# Patient Record
Sex: Female | Born: 1965 | Race: White | Hispanic: No | Marital: Married | Smoking: Former smoker
Health system: Southern US, Community
[De-identification: ages and names within clinical notes are randomized; demographics above are authoritative.]

## PROBLEM LIST (undated history)

## (undated) DIAGNOSIS — E785 Hyperlipidemia, unspecified: Secondary | ICD-10-CM

## (undated) DIAGNOSIS — N816 Rectocele: Secondary | ICD-10-CM

## (undated) DIAGNOSIS — F32A Depression, unspecified: Secondary | ICD-10-CM

## (undated) DIAGNOSIS — F419 Anxiety disorder, unspecified: Secondary | ICD-10-CM

## (undated) DIAGNOSIS — R112 Nausea with vomiting, unspecified: Secondary | ICD-10-CM

## (undated) DIAGNOSIS — L409 Psoriasis, unspecified: Secondary | ICD-10-CM

## (undated) DIAGNOSIS — E039 Hypothyroidism, unspecified: Secondary | ICD-10-CM

## (undated) DIAGNOSIS — E669 Obesity, unspecified: Secondary | ICD-10-CM

## (undated) DIAGNOSIS — Z9089 Acquired absence of other organs: Secondary | ICD-10-CM

## (undated) DIAGNOSIS — Z9889 Other specified postprocedural states: Secondary | ICD-10-CM

## (undated) DIAGNOSIS — Z90710 Acquired absence of both cervix and uterus: Secondary | ICD-10-CM

## (undated) DIAGNOSIS — J449 Chronic obstructive pulmonary disease, unspecified: Secondary | ICD-10-CM

## (undated) DIAGNOSIS — I10 Essential (primary) hypertension: Secondary | ICD-10-CM

## (undated) DIAGNOSIS — F1111 Opioid abuse, in remission: Secondary | ICD-10-CM

## (undated) HISTORY — PX: TUBAL LIGATION: SHX77

## (undated) HISTORY — PX: SPINAL FUSION: SHX223

## (undated) HISTORY — PX: BACK SURGERY: SHX140

## (undated) HISTORY — PX: FACIAL COSMETIC SURGERY: SHX629

---

## 2013-04-09 ENCOUNTER — Ambulatory Visit: Payer: Self-pay

## 2020-04-15 ENCOUNTER — Encounter (HOSPITAL_BASED_OUTPATIENT_CLINIC_OR_DEPARTMENT_OTHER): Payer: Self-pay | Admitting: Obstetrics and Gynecology

## 2020-04-15 NOTE — H&P (Signed)
Marie Delacruz is an 54 y.o. female. With  w/sypmtomatic posterior prolapse. Occasional urinary incontinence  but declines urology referral. Hx SVD x2.  Pertinent Gynecological History: Menses: post-menopausal Bleeding: n/a Contraception: post menopausal status DES exposure: denies Blood transfusions: none Sexually transmitted diseases: no past history Previous GYN Procedures: n/a  Last mammogram: normal Date: several years ago Last pap: normal Date: 12/2019 OB History: G2, P2 - SVD x 2   Menstrual History: No LMP recorded.    Past Medical History:  Diagnosis Date  . COPD (chronic obstructive pulmonary disease) (HCC)   . HLD (hyperlipidemia)   . HTN (hypertension)   . Hypothyroid   . 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 Obesity   . Prolapse of posterior vaginal wall     Past Surgical History:  Procedure Laterality Date  . FACIAL COSMETIC SURGERY    . SPINAL FUSION    . TUBAL LIGATION      No family history on file.  Social History:  has no history on file for tobacco use, alcohol use, and drug use.  Allergies: Not on File  No medications prior to admission.    Review of Systems  There were no vitals taken for this visit. Physical Exam  Gen: well appearing, NAD CV: Reg rate Pulm: NWOB Abd: soft, nondistended, nontender, no masses, obese GYN: uterus 8 week size, no adnexa ttp/CMT., Posterior vaginal prolapse. CST negative. No anterior prolapse noted.  Ext: No edema b/l   No results found for this or any previous visit (from the past 24 hour(s)).  No results found.  Assessment/Plan: 54 yo G2 P2 with symptomatic posterior prolapse without uterine prolapse. All options were discussed for management. We discussed medical management and surgical options. After careful discussing, weighing the risks and benefits, she elected to have a hysterectomy.  We discussed my plan int he office for  surgery which includes TVH, cystoscopy, McCalls, posterior repair, and POSSIBLE salpingectomy and POSSIBLE anterior repair.   Risks were reviewed including infection, bleeding, damage to surrounding structures, need for additional procedures, conversion to an open procedure, future prolapse, pain s/s scarring, the possibility of cancer/unexpected abnormality on final pathology, recurrent prolapse, worsening urinary/bowel issues, and thromboembolic disease.  We discussed the risks/benefits of retaining tubes and ovaries vs removal and she elects to salpingo-oophorectomy if able to remove bothbut wants to keep ovaries.   Ranae Pila 04/15/2020, 9:13 AM

## 2020-04-29 NOTE — Progress Notes (Signed)
Marie Delacruz  04/29/2020      Your procedure is scheduled 05/08/2020   Report to [facility] [place]      0530A.M.  Call this number if you have problems the morning of surgery:[phone]  OUR ADDRESS IS [address], WE ARE LOCATED IN THE [facility]ICAL [facility] WITH [facility].   Remember:  Do not eat food after midnight    NO SOLID FOOD AFTER MIDNIGHT THE NIGHT PRIOR TO SURGERY. NOTHING BY MOUTH EXCEPT CLEAR LIQUIDS UNTIL  0430am . PLEASE FINISH ENSURE DRINK PER SURGEON ORDER  WHICH NEEDS TO BE COMPLETED AT 0430am.  Take these medicines the morning of surgery with A SIP OF WATER Synthroid, flonase if needed, Cymbalta, Inhalers as usual and bring   Do not wear jewelry, make-up or nail polish.  Do not wear lotions, powders, or perfumes, or deoderant.  Do not shave 48 hours prior to surgery.  Men may shave face and neck.  Do not bring valuables to the hospital.  [facility] is not responsible for any belongings or valuables.  Contacts, dentures or bridgework may not be worn into surgery.  Leave your suitcase in the car.  After surgery it may be brought to your room.  For patients admitted to the hospital, discharge time will be determined by your treatment team.  Patients discharged the day of surgery will not be allowed to drive home.     Please read over the following fact sheets that you were given:   [facility] - Preparing for Surgery Before surgery, you can play an important role.  Because skin is not sterile, your skin needs to be as free of germs as possible.  You can reduce the number of germs on your skin by washing with CHG (chlorahexidine gluconate) soap before surgery.  CHG is an antiseptic cleaner which kills germs and bonds with the skin to continue killing germs even after washing. Please DO NOT use if you have an allergy to CHG or antibacterial soaps.  If your skin becomes reddened/irritated stop using the CHG and inform your nurse when you arrive  at Short Stay. Do not shave (including legs and underarms) for at least 48 hours prior to the first CHG shower.  You may shave your face/neck. Please follow these instructions carefully:  1.  Shower with CHG Soap the night before surgery and the  morning of Surgery.  2.  If you choose to wash your hair, wash your hair first as usual with your  normal  shampoo.  3.  After you shampoo, rinse your hair and body thoroughly to remove the  shampoo.                           4.  Use CHG as you would any other liquid soap.  You can apply chg directly  to the skin and wash                       Gently with a scrungie or clean washcloth.  5.  Apply the CHG Soap to your body ONLY FROM THE NECK DOWN.   Do not use on face/ open                           Wound or open sores. Avoid contact with eyes, ears mouth and genitals (private parts).  Wash face,  Genitals (private parts) with your normal soap.             6.  Wash thoroughly, paying special attention to the area where your surgery  will be performed.  7.  Thoroughly rinse your body with warm water from the neck down.  8.  DO NOT shower/wash with your normal soap after using and rinsing off  the CHG Soap.                9.  Pat yourself dry with a clean towel.            10.  Wear clean pajamas.            11.  Place clean sheets on your bed the night of your first shower and do not  sleep with pets. Day of Surgery : Do not apply any lotions/deodorants the morning of surgery.  Please wear clean clothes to the hospital/surgery center.  FAILURE TO FOLLOW THESE INSTRUCTIONS MAY RESULT IN THE CANCELLATION OF YOUR SURGERY PATIENT SIGNATURE_________________________________  NURSE SIGNATURE__________________________________  ________________________________________________________________________

## 2020-04-30 ENCOUNTER — Other Ambulatory Visit: Payer: Self-pay

## 2020-04-30 ENCOUNTER — Encounter (HOSPITAL_COMMUNITY)
Admission: RE | Admit: 2020-04-30 | Discharge: 2020-04-30 | Disposition: A | Discharge status: Home / Self Care | Payer: 59 | Source: Ambulatory Visit | Attending: Obstetrics and Gynecology | Admitting: Obstetrics and Gynecology

## 2020-04-30 ENCOUNTER — Encounter (HOSPITAL_COMMUNITY): Payer: Self-pay

## 2020-04-30 DIAGNOSIS — Z01818 Encounter for other preprocedural examination: Secondary | ICD-10-CM | POA: Diagnosis not present

## 2020-04-30 HISTORY — DX: Anxiety disorder, unspecified: F41.9

## 2020-04-30 HISTORY — DX: Other specified postprocedural states: R11.2

## 2020-04-30 HISTORY — DX: Other specified postprocedural states: Z98.890

## 2020-04-30 HISTORY — DX: Depression, unspecified: F32.A

## 2020-04-30 LAB — BASIC METABOLIC PANEL
Anion gap: 10 (ref 5–15)
BUN: 11 mg/dL (ref 6–20)
CO2: 29 mmol/L (ref 22–32)
Calcium: 9.7 mg/dL (ref 8.9–10.3)
Chloride: 100 mmol/L (ref 98–111)
Creatinine, Ser: 0.9 mg/dL (ref 0.44–1.00)
GFR calc Af Amer: >60 mL/min (ref >60)
GFR calc non Af Amer: >60 mL/min (ref >60)
Glucose, Bld: 143 mg/dL — ABNORMAL HIGH (ref 70–99)
Potassium: 3.6 mmol/L (ref 3.5–5.1)
Sodium: 139 mmol/L (ref 135–145)

## 2020-04-30 LAB — CBC
HCT: 43.5 % (ref 36.0–46.0)
Hemoglobin: 14.5 g/dL (ref 12.0–15.0)
MCH: 32.3 pg (ref 26.0–34.0)
MCHC: 33.3 g/dL (ref 30.0–36.0)
MCV: 96.9 fL (ref 80.0–100.0)
Platelets: 334 10*3/uL (ref 150–400)
RBC: 4.49 MIL/uL (ref 3.87–5.11)
RDW: 13.9 % (ref 11.5–15.5)
WBC: 11.4 10*3/uL — ABNORMAL HIGH (ref 4.0–10.5)
nRBC: 0 % (ref 0.0–0.2)

## 2020-05-05 ENCOUNTER — Other Ambulatory Visit (HOSPITAL_COMMUNITY): Payer: 59

## 2020-05-05 ENCOUNTER — Other Ambulatory Visit (HOSPITAL_COMMUNITY)
Admission: RE | Admit: 2020-05-05 | Discharge: 2020-05-05 | Disposition: A | Discharge status: Home / Self Care | Payer: 59 | Source: Ambulatory Visit | Attending: Obstetrics and Gynecology | Admitting: Obstetrics and Gynecology

## 2020-05-05 DIAGNOSIS — Z20822 Contact with and (suspected) exposure to covid-19: Secondary | ICD-10-CM | POA: Diagnosis not present

## 2020-05-05 DIAGNOSIS — Z01812 Encounter for preprocedural laboratory examination: Secondary | ICD-10-CM | POA: Insufficient documentation

## 2020-05-05 LAB — SARS CORONAVIRUS 2 (TAT 6-24 HRS): SARS Coronavirus 2: NEGATIVE

## 2020-05-08 ENCOUNTER — Encounter (HOSPITAL_BASED_OUTPATIENT_CLINIC_OR_DEPARTMENT_OTHER): Payer: Self-pay | Admitting: Obstetrics and Gynecology

## 2020-05-08 ENCOUNTER — Encounter (HOSPITAL_BASED_OUTPATIENT_CLINIC_OR_DEPARTMENT_OTHER)
Admission: RE | Disposition: A | Discharge status: Home / Self Care | Payer: Self-pay | Source: Home / Self Care | Attending: Obstetrics and Gynecology

## 2020-05-08 ENCOUNTER — Observation Stay (HOSPITAL_BASED_OUTPATIENT_CLINIC_OR_DEPARTMENT_OTHER): Payer: 59 | Admitting: Anesthesiology

## 2020-05-08 ENCOUNTER — Observation Stay (HOSPITAL_BASED_OUTPATIENT_CLINIC_OR_DEPARTMENT_OTHER)
Admission: RE | Admit: 2020-05-08 | Discharge: 2020-05-09 | Disposition: A | Discharge status: Home / Self Care | Payer: 59 | Attending: Obstetrics and Gynecology | Admitting: Obstetrics and Gynecology

## 2020-05-08 ENCOUNTER — Other Ambulatory Visit: Payer: Self-pay

## 2020-05-08 DIAGNOSIS — N819 Female genital prolapse, unspecified: Secondary | ICD-10-CM | POA: Diagnosis present

## 2020-05-08 DIAGNOSIS — J449 Chronic obstructive pulmonary disease, unspecified: Secondary | ICD-10-CM | POA: Insufficient documentation

## 2020-05-08 DIAGNOSIS — N8189 Other female genital prolapse: Secondary | ICD-10-CM | POA: Diagnosis not present

## 2020-05-08 DIAGNOSIS — E039 Hypothyroidism, unspecified: Secondary | ICD-10-CM | POA: Diagnosis not present

## 2020-05-08 DIAGNOSIS — I10 Essential (primary) hypertension: Secondary | ICD-10-CM | POA: Diagnosis not present

## 2020-05-08 DIAGNOSIS — Z90710 Acquired absence of both cervix and uterus: Secondary | ICD-10-CM | POA: Diagnosis present

## 2020-05-08 HISTORY — PX: CYSTOSCOPY: SHX5120

## 2020-05-08 HISTORY — DX: Chronic obstructive pulmonary disease, unspecified: J44.9

## 2020-05-08 HISTORY — DX: Obesity, unspecified: E66.9

## 2020-05-08 HISTORY — DX: Hypothyroidism, unspecified: E03.9

## 2020-05-08 HISTORY — DX: Hyperlipidemia, unspecified: E78.5

## 2020-05-08 HISTORY — DX: Opioid abuse, in remission: F11.11

## 2020-05-08 HISTORY — PX: RECTOCELE REPAIR: SHX761

## 2020-05-08 HISTORY — PX: VAGINAL HYSTERECTOMY: SHX2639

## 2020-05-08 HISTORY — DX: Rectocele: N81.6

## 2020-05-08 HISTORY — DX: Essential (primary) hypertension: I10

## 2020-05-08 LAB — TYPE AND SCREEN
ABO/RH(D): O POS
Antibody Screen: NEGATIVE

## 2020-05-08 LAB — ABO/RH: ABO/RH(D): O POS

## 2020-05-08 SURGERY — HYSTERECTOMY, VAGINAL
Anesthesia: General | Site: Vagina

## 2020-05-08 MED ORDER — ACETAMINOPHEN 500 MG PO TABS
1000.0000 mg | ORAL_TABLET | ORAL | Status: AC
  Administered 2020-05-08: 1000 mg via ORAL

## 2020-05-08 MED ORDER — VASOPRESSIN 20 UNIT/ML IV SOLN
INTRAVENOUS | Status: DC | PRN
  Administered 2020-05-08 (×2): 20 [IU] via INTRAMUSCULAR

## 2020-05-08 MED ORDER — ACETAMINOPHEN 500 MG PO TABS
1000.0000 mg | ORAL_TABLET | Freq: Four times a day (QID) | ORAL | Status: DC
  Administered 2020-05-08 – 2020-05-09 (×4): 1000 mg via ORAL

## 2020-05-08 MED ORDER — KETAMINE HCL 10 MG/ML IJ SOLN
INTRAMUSCULAR | Status: AC
  Filled 2020-05-08: qty 1

## 2020-05-08 MED ORDER — GABAPENTIN 300 MG PO CAPS
300.0000 mg | ORAL_CAPSULE | ORAL | Status: AC
  Administered 2020-05-08: 300 mg via ORAL

## 2020-05-08 MED ORDER — PROPOFOL 10 MG/ML IV BOLUS
INTRAVENOUS | Status: AC
  Filled 2020-05-08: qty 20

## 2020-05-08 MED ORDER — ALUM & MAG HYDROXIDE-SIMETH 200-200-20 MG/5ML PO SUSP: 30.0000 mL | ORAL | Status: DC | PRN

## 2020-05-08 MED ORDER — ACETAMINOPHEN 500 MG PO TABS
ORAL_TABLET | ORAL | Status: AC
  Filled 2020-05-08: qty 2

## 2020-05-08 MED ORDER — MIDAZOLAM HCL 2 MG/2ML IJ SOLN
INTRAMUSCULAR | Status: AC
  Filled 2020-05-08: qty 2

## 2020-05-08 MED ORDER — SUGAMMADEX SODIUM 200 MG/2ML IV SOLN
INTRAVENOUS | Status: DC | PRN
  Administered 2020-05-08: 200 mg via INTRAVENOUS

## 2020-05-08 MED ORDER — KETOROLAC TROMETHAMINE 30 MG/ML IJ SOLN
INTRAMUSCULAR | Status: AC
  Filled 2020-05-08: qty 1

## 2020-05-08 MED ORDER — ROCURONIUM BROMIDE 10 MG/ML (PF) SYRINGE
PREFILLED_SYRINGE | INTRAVENOUS | Status: AC
  Filled 2020-05-08: qty 10

## 2020-05-08 MED ORDER — HYDROMORPHONE HCL 1 MG/ML IJ SOLN
INTRAMUSCULAR | Status: AC
  Filled 2020-05-08: qty 1

## 2020-05-08 MED ORDER — ONDANSETRON HCL 4 MG/2ML IJ SOLN
INTRAMUSCULAR | Status: DC | PRN
  Administered 2020-05-08: 4 mg via INTRAVENOUS

## 2020-05-08 MED ORDER — IBUPROFEN 800 MG PO TABS: 800.0000 mg | ORAL_TABLET | Freq: Four times a day (QID) | ORAL | Status: DC

## 2020-05-08 MED ORDER — DOCUSATE SODIUM 100 MG PO CAPS
ORAL_CAPSULE | ORAL | Status: AC
  Filled 2020-05-08: qty 1

## 2020-05-08 MED ORDER — TRAMADOL HCL 50 MG PO TABS
ORAL_TABLET | ORAL | Status: AC
  Filled 2020-05-08: qty 1

## 2020-05-08 MED ORDER — SIMETHICONE 80 MG PO CHEW
CHEWABLE_TABLET | ORAL | Status: AC
  Filled 2020-05-08: qty 1

## 2020-05-08 MED ORDER — SCOPOLAMINE 1 MG/3DAYS TD PT72
1.0000 | MEDICATED_PATCH | TRANSDERMAL | Status: DC
  Administered 2020-05-08: 1.5 mg via TRANSDERMAL

## 2020-05-08 MED ORDER — SIMETHICONE 80 MG PO CHEW
80.0000 mg | CHEWABLE_TABLET | Freq: Four times a day (QID) | ORAL | Status: DC | PRN
  Administered 2020-05-08: 80 mg via ORAL

## 2020-05-08 MED ORDER — SCOPOLAMINE 1 MG/3DAYS TD PT72
MEDICATED_PATCH | TRANSDERMAL | Status: AC
  Filled 2020-05-08: qty 1

## 2020-05-08 MED ORDER — FLUTICASONE PROPIONATE 50 MCG/ACT NA SUSP: 1.0000 | Freq: Every day | NASAL | Status: DC | PRN

## 2020-05-08 MED ORDER — DULOXETINE HCL 60 MG PO CPEP: 60.0000 mg | ORAL_CAPSULE | Freq: Every day | ORAL | Status: DC

## 2020-05-08 MED ORDER — LISINOPRIL 20 MG PO TABS: 20.0000 mg | ORAL_TABLET | Freq: Every day | ORAL | Status: DC

## 2020-05-08 MED ORDER — PROPOFOL 10 MG/ML IV BOLUS
INTRAVENOUS | Status: DC | PRN
  Administered 2020-05-08: 180 mg via INTRAVENOUS

## 2020-05-08 MED ORDER — GABAPENTIN 300 MG PO CAPS
ORAL_CAPSULE | ORAL | Status: AC
  Filled 2020-05-08: qty 1

## 2020-05-08 MED ORDER — UMECLIDINIUM-VILANTEROL 62.5-25 MCG/INH IN AEPB: 1.0000 | INHALATION_SPRAY | Freq: Every day | RESPIRATORY_TRACT | Status: DC | PRN

## 2020-05-08 MED ORDER — ATORVASTATIN CALCIUM 40 MG PO TABS
40.0000 mg | ORAL_TABLET | Freq: Every day | ORAL | Status: DC
  Administered 2020-05-08: 40 mg via ORAL
  Filled 2020-05-08: qty 1

## 2020-05-08 MED ORDER — MIDAZOLAM HCL 5 MG/5ML IJ SOLN
INTRAMUSCULAR | Status: DC | PRN
  Administered 2020-05-08: 2 mg via INTRAVENOUS

## 2020-05-08 MED ORDER — TRAMADOL HCL 50 MG PO TABS
50.0000 mg | ORAL_TABLET | ORAL | Status: DC | PRN
  Administered 2020-05-08 – 2020-05-09 (×3): 50 mg via ORAL

## 2020-05-08 MED ORDER — OXYCODONE HCL 5 MG PO TABS: 5.0000 mg | ORAL_TABLET | Freq: Once | ORAL | Status: DC | PRN

## 2020-05-08 MED ORDER — DOCUSATE SODIUM 100 MG PO CAPS
100.0000 mg | ORAL_CAPSULE | Freq: Two times a day (BID) | ORAL | Status: DC
  Administered 2020-05-08 (×2): 100 mg via ORAL

## 2020-05-08 MED ORDER — CEFAZOLIN SODIUM-DEXTROSE 2-4 GM/100ML-% IV SOLN
2.0000 g | INTRAVENOUS | Status: AC
  Administered 2020-05-08: 2 g via INTRAVENOUS

## 2020-05-08 MED ORDER — ONDANSETRON HCL 4 MG/2ML IJ SOLN: 4.0000 mg | Freq: Once | INTRAMUSCULAR | Status: DC | PRN

## 2020-05-08 MED ORDER — KETOROLAC TROMETHAMINE 30 MG/ML IJ SOLN
INTRAMUSCULAR | Status: DC | PRN
  Administered 2020-05-08: 30 mg via INTRAVENOUS

## 2020-05-08 MED ORDER — PANTOPRAZOLE SODIUM 40 MG PO TBEC
DELAYED_RELEASE_TABLET | ORAL | Status: AC
  Filled 2020-05-08: qty 1

## 2020-05-08 MED ORDER — LIDOCAINE 2% (20 MG/ML) 5 ML SYRINGE
INTRAMUSCULAR | Status: AC
  Filled 2020-05-08: qty 5

## 2020-05-08 MED ORDER — TRIAMCINOLONE ACETONIDE 0.1 % EX CREA: 1.0000 "application " | TOPICAL_CREAM | Freq: Every day | CUTANEOUS | Status: DC | PRN

## 2020-05-08 MED ORDER — OXYCODONE HCL 5 MG/5ML PO SOLN: 5.0000 mg | Freq: Once | ORAL | Status: DC | PRN

## 2020-05-08 MED ORDER — MENTHOL 3 MG MT LOZG: 1.0000 | LOZENGE | OROMUCOSAL | Status: DC | PRN

## 2020-05-08 MED ORDER — ONDANSETRON HCL 4 MG PO TABS: 4.0000 mg | ORAL_TABLET | Freq: Four times a day (QID) | ORAL | Status: DC | PRN

## 2020-05-08 MED ORDER — KETAMINE HCL 10 MG/ML IJ SOLN
INTRAMUSCULAR | Status: DC | PRN
  Administered 2020-05-08: 30 mg via INTRAVENOUS

## 2020-05-08 MED ORDER — FENTANYL CITRATE (PF) 100 MCG/2ML IJ SOLN
INTRAMUSCULAR | Status: DC | PRN
Start: 2020-05-08 — End: 2020-05-08
  Administered 2020-05-08: 50 ug via INTRAVENOUS
  Administered 2020-05-08: 100 ug via INTRAVENOUS
  Administered 2020-05-08 (×2): 50 ug via INTRAVENOUS

## 2020-05-08 MED ORDER — KETOROLAC TROMETHAMINE 30 MG/ML IJ SOLN
30.0000 mg | Freq: Four times a day (QID) | INTRAMUSCULAR | Status: DC
  Administered 2020-05-08 – 2020-05-09 (×3): 30 mg via INTRAVENOUS

## 2020-05-08 MED ORDER — HYDROMORPHONE HCL 1 MG/ML IJ SOLN
0.2500 mg | INTRAMUSCULAR | Status: DC | PRN
  Administered 2020-05-08 (×2): 0.5 mg via INTRAVENOUS

## 2020-05-08 MED ORDER — DEXTROSE-NACL 5-0.45 % IV SOLN: 125.0000 mL/h | INTRAVENOUS | Status: DC

## 2020-05-08 MED ORDER — SODIUM CHLORIDE FLUSH 0.9 % IV SOLN
INTRAVENOUS | Status: DC | PRN
  Administered 2020-05-08 (×2): 60 mL

## 2020-05-08 MED ORDER — LISINOPRIL 20 MG PO TABS
20.0000 mg | ORAL_TABLET | Freq: Every day | ORAL | Status: DC
  Administered 2020-05-08: 20 mg via ORAL
  Filled 2020-05-08: qty 1

## 2020-05-08 MED ORDER — LACTATED RINGERS IV SOLN
INTRAVENOUS | Status: DC
  Administered 2020-05-08: 1000 mL via INTRAVENOUS

## 2020-05-08 MED ORDER — HYDROMORPHONE HCL 1 MG/ML IJ SOLN
0.2000 mg | INTRAMUSCULAR | Status: DC | PRN
  Administered 2020-05-08: 0.6 mg via INTRAVENOUS

## 2020-05-08 MED ORDER — LIDOCAINE 2% (20 MG/ML) 5 ML SYRINGE
INTRAMUSCULAR | Status: DC | PRN
  Administered 2020-05-08: 80 mg via INTRAVENOUS

## 2020-05-08 MED ORDER — DEXAMETHASONE SODIUM PHOSPHATE 10 MG/ML IJ SOLN
INTRAMUSCULAR | Status: DC | PRN
  Administered 2020-05-08: 10 mg via INTRAVENOUS

## 2020-05-08 MED ORDER — CEFAZOLIN SODIUM-DEXTROSE 2-4 GM/100ML-% IV SOLN
INTRAVENOUS | Status: AC
  Filled 2020-05-08: qty 100

## 2020-05-08 MED ORDER — ONDANSETRON HCL 4 MG/2ML IJ SOLN: 4.0000 mg | Freq: Four times a day (QID) | INTRAMUSCULAR | Status: DC | PRN

## 2020-05-08 MED ORDER — FENTANYL CITRATE (PF) 250 MCG/5ML IJ SOLN
INTRAMUSCULAR | Status: AC
  Filled 2020-05-08: qty 5

## 2020-05-08 MED ORDER — TRAMADOL HCL 50 MG PO TABS
50.0000 mg | ORAL_TABLET | Freq: Four times a day (QID) | ORAL | Status: DC | PRN
  Administered 2020-05-08 (×2): 50 mg via ORAL

## 2020-05-08 MED ORDER — ESTRADIOL 0.1 MG/GM VA CREA
TOPICAL_CREAM | VAGINAL | Status: DC | PRN
  Administered 2020-05-08: 1 via VAGINAL

## 2020-05-08 MED ORDER — ROCURONIUM BROMIDE 10 MG/ML (PF) SYRINGE
PREFILLED_SYRINGE | INTRAVENOUS | Status: DC | PRN
  Administered 2020-05-08: 50 mg via INTRAVENOUS
  Administered 2020-05-08: 10 mg via INTRAVENOUS

## 2020-05-08 MED ORDER — LIDOCAINE HCL 2 % IJ SOLN
INTRAMUSCULAR | Status: AC
  Filled 2020-05-08: qty 10

## 2020-05-08 MED ORDER — DEXAMETHASONE SODIUM PHOSPHATE 10 MG/ML IJ SOLN
INTRAMUSCULAR | Status: AC
  Filled 2020-05-08: qty 1

## 2020-05-08 MED ORDER — LEVOTHYROXINE SODIUM 75 MCG PO TABS
75.0000 ug | ORAL_TABLET | Freq: Every day | ORAL | Status: DC
  Administered 2020-05-09: 75 ug via ORAL
  Filled 2020-05-08: qty 1

## 2020-05-08 MED ORDER — ONDANSETRON HCL 4 MG/2ML IJ SOLN
INTRAMUSCULAR | Status: AC
  Filled 2020-05-08: qty 2

## 2020-05-08 MED ORDER — SOD CITRATE-CITRIC ACID 500-334 MG/5ML PO SOLN: 30.0000 mL | ORAL | Status: DC

## 2020-05-08 MED ORDER — SODIUM CHLORIDE 0.9 % IR SOLN
Status: DC | PRN
  Administered 2020-05-08: 200 mL

## 2020-05-08 MED ORDER — PANTOPRAZOLE SODIUM 40 MG PO TBEC
40.0000 mg | DELAYED_RELEASE_TABLET | Freq: Every day | ORAL | Status: DC
  Administered 2020-05-08: 40 mg via ORAL

## 2020-05-08 MED ORDER — LIDOCAINE 20MG/ML (2%) 15 ML SYRINGE OPTIME
INTRAMUSCULAR | Status: DC | PRN
  Administered 2020-05-08: 1.5 mg/kg/h via INTRAVENOUS

## 2020-05-08 MED ORDER — POVIDONE-IODINE 10 % EX SWAB: 2.0000 "application " | Freq: Once | CUTANEOUS | Status: DC

## 2020-05-08 SURGICAL SUPPLY — 51 items
BAG DECANTER FOR FLEXI CONT (MISCELLANEOUS) ×4 IMPLANT
BLADE SURG 15 STRL LF DISP TIS (BLADE) ×2 IMPLANT
BLADE SURG 15 STRL SS (BLADE) ×4
BNDG GAUZE ELAST 4 BULKY (GAUZE/BANDAGES/DRESSINGS) ×4 IMPLANT
CANISTER SUCT 3000ML PPV (MISCELLANEOUS) ×4 IMPLANT
CATH ROBINSON RED A/P 16FR (CATHETERS) IMPLANT
COVER WAND RF STERILE (DRAPES) ×4 IMPLANT
DECANTER SPIKE VIAL GLASS SM (MISCELLANEOUS) IMPLANT
DURAPREP 26ML APPLICATOR (WOUND CARE) ×4 IMPLANT
GAUZE 4X4 16PLY RFD (DISPOSABLE) ×4 IMPLANT
GAUZE PACKING 1 X5 YD ST (GAUZE/BANDAGES/DRESSINGS) IMPLANT
GLOVE BIO SURGEON STRL SZ 6.5 (GLOVE) ×6 IMPLANT
GLOVE BIO SURGEON STRL SZ7.5 (GLOVE) ×4 IMPLANT
GLOVE BIO SURGEONS STRL SZ 6.5 (GLOVE) ×2
GLOVE BIOGEL PI IND STRL 6 (GLOVE) ×2 IMPLANT
GLOVE BIOGEL PI IND STRL 6.5 (GLOVE) ×4 IMPLANT
GLOVE BIOGEL PI IND STRL 7.0 (GLOVE) ×6 IMPLANT
GLOVE BIOGEL PI IND STRL 7.5 (GLOVE) ×4 IMPLANT
GLOVE BIOGEL PI INDICATOR 6 (GLOVE) ×2
GLOVE BIOGEL PI INDICATOR 6.5 (GLOVE) ×4
GLOVE BIOGEL PI INDICATOR 7.0 (GLOVE) ×6
GLOVE BIOGEL PI INDICATOR 7.5 (GLOVE) ×4
GLOVE ECLIPSE 7.5 STRL STRAW (GLOVE) ×4 IMPLANT
GOWN STRL REUS W/ TWL LRG LVL3 (GOWN DISPOSABLE) ×4 IMPLANT
GOWN STRL REUS W/TWL LRG LVL3 (GOWN DISPOSABLE) ×20 IMPLANT
IV NS 1000ML (IV SOLUTION) ×4
IV NS 1000ML BAXH (IV SOLUTION) ×2 IMPLANT
KIT TURNOVER CYSTO (KITS) ×4 IMPLANT
LIGASURE IMPACT 36 18CM CVD LR (INSTRUMENTS) ×4 IMPLANT
LUBRICANT JELLY K Y 4OZ (MISCELLANEOUS) ×4 IMPLANT
NDL SAFETY ECLIPSE 18X1.5 (NEEDLE) ×2 IMPLANT
NEEDLE HYPO 18GX1.5 SHARP (NEEDLE) ×4
NEEDLE HYPO 22GX1.5 SAFETY (NEEDLE) ×4 IMPLANT
NS IRRIG 500ML POUR BTL (IV SOLUTION) ×4 IMPLANT
PACK VAGINAL WOMENS (CUSTOM PROCEDURE TRAY) ×4 IMPLANT
PACKING VAGINAL (PACKING) IMPLANT
PAD OB MATERNITY 4.3X12.25 (PERSONAL CARE ITEMS) ×4 IMPLANT
SET IRRIG Y TYPE TUR BLADDER L (SET/KITS/TRAYS/PACK) ×4 IMPLANT
SHEET LAVH (DRAPES) ×4 IMPLANT
SPONGE LAP 4X18 RFD (DISPOSABLE) ×8 IMPLANT
SUT VIC AB 0 CT1 18XCR BRD8 (SUTURE) ×2 IMPLANT
SUT VIC AB 0 CT1 36 (SUTURE) ×4 IMPLANT
SUT VIC AB 0 CT1 8-18 (SUTURE) ×4
SUT VIC AB 2-0 UR6 27 (SUTURE) ×4 IMPLANT
SUT VIC AB 3-0 CT1 36 (SUTURE) ×4 IMPLANT
SUT VICRYL 0 TIES 12 18 (SUTURE) ×4 IMPLANT
SYR BULB IRRIG 60ML STRL (SYRINGE) IMPLANT
TOWEL OR 17X26 10 PK STRL BLUE (TOWEL DISPOSABLE) ×4 IMPLANT
TRAY FOLEY W/BAG SLVR 14FR (SET/KITS/TRAYS/PACK) ×8 IMPLANT
TRAY FOLEY W/BAG SLVR 14FR LF (SET/KITS/TRAYS/PACK) ×4 IMPLANT
WATER STERILE IRR 500ML POUR (IV SOLUTION) IMPLANT

## 2020-05-08 NOTE — Anesthesia Preprocedure Evaluation (Addendum)
Anesthesia Evaluation  Patient identified by MRN, date of birth, ID band  Reviewed: Patient's Chart, lab work & pertinent test results  History of Anesthesia Complications (+) PONV  Airway Mallampati: II  TM Distance: >3 FB Neck ROM: Full    Dental  (+) Teeth Intact   Pulmonary COPD, former smoker,    Pulmonary exam normal        Cardiovascular hypertension, Pt. on medications  Rhythm:Regular Rate:Normal     Neuro/Psych Anxiety Depression negative neurological ROS     GI/Hepatic negative GI ROS, Neg liver ROS,   Endo/Other  Hypothyroidism   Renal/GU negative Renal ROS  Female GU complaint Pelvic prolapse    Musculoskeletal negative musculoskeletal ROS (+)   Abdominal (+)  Abdomen: soft. Bowel sounds: normal.  Peds negative pediatric ROS (+)  Hematology negative hematology ROS (+)   Anesthesia Other Findings   Reproductive/Obstetrics negative OB ROS                           Anesthesia Physical Anesthesia Plan  ASA: II  Anesthesia Plan: General   Post-op Pain Management:    Induction: Intravenous  PONV Risk Score and Plan: 4 or greater and Ondansetron, Dexamethasone, Scopolamine patch - Pre-op and Propofol infusion  Airway Management Planned: Oral ETT and Mask  Additional Equipment: None  Intra-op Plan:   Post-operative Plan: Extubation in OR  Informed Consent: I have reviewed the patients History and Physical, chart, labs and discussed the procedure including the risks, benefits and alternatives for the proposed anesthesia with the patient or authorized representative who has indicated his/her understanding and acceptance.     Dental advisory given  Plan Discussed with: CRNA  Anesthesia Plan Comments: (Lab Results      Component                Value               Date                      WBC                      11.4 (H)            04/30/2020                HGB                       14.5                04/30/2020                HCT                      43.5                04/30/2020                MCV                      96.9                04/30/2020                PLT                      334  04/30/2020           Covid-19 Nucleic Acid Test Results Lab Results      Component                Value               Date                      [id number]              NEGATIVE            05/05/2020           )      Anesthesia Quick Evaluation

## 2020-05-08 NOTE — Progress Notes (Signed)
Day of Surgery Procedure(s) (LRB): HYSTERECTOMY VAGINAL (N/A) CYSTOSCOPY (N/A) POSTERIOR REPAIR (RECTOCELE), MCCALLS, (N/A)  Subjective: Patient reports tolerating PO.  She has already ate a full lunch and walked through the halls.   Objective: I have reviewed patient's vital signs, intake and output and medications.  General: alert and cooperative Resp: NWOB Cardio: regular rate and rhythm GI: soft, non-tender; bowel sounds normal; no masses,  no organomegaly Extremities: extremities normal, atraumatic, no cyanosis or edema and Homans sign is negative, no sign of DVT Vaginal Bleeding: minimal - vaginal packing in place  Assessment: s/p Procedure(s) with comments: HYSTERECTOMY VAGINAL (N/A) - need bed CYSTOSCOPY (N/A) POSTERIOR REPAIR (RECTOCELE), MCCALLS, (N/A): stable and progressing well  Plan: Continue to advance to PO meds and continue to ambulation. Foley and packing out at 0500 and trial of void  LOS: 0 days    Marie Delacruz 05/08/2020, 9:38 AM

## 2020-05-08 NOTE — Anesthesia Procedure Notes (Signed)
Procedure Name: Intubation Date/Time: 05/08/2020 7:37 AM Performed by: Jadakiss Barish D, CRNA Pre-anesthesia Checklist: Patient identified, Emergency Drugs available, Suction available and Patient being monitored Patient Re-evaluated:Patient Re-evaluated prior to induction Oxygen Delivery Method: Circle system utilized Preoxygenation: Pre-oxygenation with 100% oxygen Induction Type: IV induction Ventilation: Mask ventilation without difficulty Laryngoscope Size: Mac and 4 Grade View: Grade II Tube type: Oral Tube size: 7.0 mm Number of attempts: 1 Airway Equipment and Method: Stylet Placement Confirmation: ETT inserted through vocal cords under direct vision,  positive ETCO2 and breath sounds checked- equal and bilateral Secured at: 21 cm Tube secured with: Tape Dental Injury: Teeth and Oropharynx as per pre-operative assessment

## 2020-05-08 NOTE — Anesthesia Postprocedure Evaluation (Signed)
Anesthesia Post Note  Patient: Marie Delacruz  Procedure(s) Performed: HYSTERECTOMY VAGINAL (N/A Vagina ) CYSTOSCOPY (N/A Bladder) POSTERIOR REPAIR (RECTOCELE), MCCALLS, (N/A Vagina )     Patient location during evaluation: PACU Anesthesia Type: General Level of consciousness: awake and alert Pain management: pain level controlled Vital Signs Assessment: post-procedure vital signs reviewed and stable Respiratory status: spontaneous breathing, nonlabored ventilation, respiratory function stable and patient connected to nasal cannula oxygen Cardiovascular status: blood pressure returned to baseline and stable Postop Assessment: no apparent nausea or vomiting Anesthetic complications: no   No complications documented.  Last Vitals:  Vitals:   05/08/20 1015 05/08/20 1038  BP: 132/88 (!) 141/88  Pulse: 92 94  Resp: 14 14  Temp: 36.6 C 36.7 C  SpO2: 98% 99%    Last Pain:  Vitals:   05/08/20 1038  TempSrc:   PainSc: 7                  Satish Hammers P [name]

## 2020-05-08 NOTE — Progress Notes (Signed)
No updates to above H&P. Patient arrived NPO and was consented in PACU. Risks again discussed, all questions answered, and consent signed. Proceed with above surgery.    Koleton Duchemin MD  

## 2020-05-08 NOTE — Transfer of Care (Signed)
Immediate Anesthesia Transfer of Care Note  Patient: Marie Delacruz  Procedure(s) Performed: HYSTERECTOMY VAGINAL (N/A Vagina ) CYSTOSCOPY (N/A Bladder) POSTERIOR REPAIR (RECTOCELE), MCCALLS, (N/A Vagina )  Patient Location: PACU  Anesthesia Type:General  Level of Consciousness: awake, alert  and oriented  Airway & Oxygen Therapy: Patient Spontanous Breathing and Patient connected to nasal cannula oxygen  Post-op Assessment: Report given to RN and Post -op Vital signs reviewed and stable  Post vital signs: Reviewed and stable  Last Vitals:  Vitals Value Taken Time  BP    Temp    Pulse 91 05/08/20 0925  Resp 14 05/08/20 0925  SpO2 100 % 05/08/20 0925  Vitals shown include unvalidated device data.  Last Pain:  Vitals:   05/08/20 0623  TempSrc: Oral  PainSc: 0-No pain      Patients Stated Pain Goal: 7 (05/08/20 7591)  Complications: No complications documented.

## 2020-05-08 NOTE — Op Note (Signed)
PREOPERATIVE DIAGNOSES: 1. Pelvic Organ prolapse  POSTOPERATIVE DIAGNOSES: 1. Same  PROCEDURE PERFORMED: Total vaginal hysterectomy with  Posterior Colporrhaphy, Modified McCall's culdoplasty, and cystoscopy  SURGEON: Dr. Belva Agee  ASSISTANT: Dr. Mitchel Honour  ANESTHESIA: General   ESTIMATED BLOOD LOSS: 50cc.  URINE OUTPUT: See anesthesia record  FLUIDS: See Anesthesia record  COMPLICATIONS: None  TUBES: None.  DRAINS: Foley catheter with vaginal packing in place  PATHOLOGY: Uterus w/cervix were sent to pathology for review.  FINDINGS: On exam, under anesthesia, normal appearing vulva and vagina, 8 week sized uterus, Stage 3 posterior prolapse, No anterior prolapse.  Operative findings demonstrated a normal sized uterus.  Procedure: The patient was prepared and draped in the usual sterile manner for an abdominoperineal procedure. A weighted speculum was placed in the posterior vaginal vault. The cervix was grasped with a teneculum on both its anterior and posterior lips. 20cc of pitressin was injected. With downward traction, we made a circumferential incision of the vaginal mucosa. This allowed dissection and entrance into the posterior cul-de-sac. The posterior peritoneum was sutured to the posterior vaginal cuff. At this time we visualized the uterosacral ligaments. These were clamped and ligated with #0 Vicryl suture. The cervicovesical space was then created by both blunt and sharp dissection, allowing [place] to see the cardinal ligaments. These were clamped and ligated as well. Once in the cervicovesical space, we were then able to completely clamp and ligate the uterosacral-cardinal ligament complex. The anterior cul-de-sac was then entered sharply. Omentum and intestines were then visualized through the anterior cul-de-sac window and we began removal of the uterine body. The uterine arteries were then clamped and ligated and carried upward toward the uterus until complete  removal was obtained. Ligasure or number 0 Vicryl suture was used on all major pedicles with Heaney clamping. The tubes were visualized on either side but were too high to safely reach vaginally.  Alice clamps were used to tent up the rectocele. A dilute vasopressin/lidocaine solution was infiltrated under the posterior vaginal mucosa midline. A transverse incision was cut. The posterior vaginal wall was opened vertically and midline up to the apex of the rectocele. The cut edges were held and splayed laterally with a series of Allis clamps. The open vaginal mucosa was then dissected laterally with a combination of sharp and blunt dissection, exposing the perirectal fascia. The perirectal fascia was then reapproximated with interrupted #2-0 Vicryl sutures to draw the lateral folds together and tuck the rectocele back. Deep interrupted sutures of #0 Vicryl were used to reapproximate the fibers of the levator ani muscles. The excess vaginal mucosa was trimmed. The posterior vaginal wall was closed with a running vicryl to the hymenal tags. Rectal exams were repeated throughout the repair and at the end to ensure there were no sutures penetrating rectal tissue.  A Foley catheter was then removed after noting clear urine. Cystoscopy was performed and a normal bladder survey with bilateral brisk ureteral jets were noted.   Vaginal packing with a small amount of estrace cream was placed in the vaginal. Packing and foley will be removed on POD#1   The sponge and lap counts were correct times 2 at this time. The patient's procedure was terminated. We then awakened her. She was sent to the Recovery Room in good condition.    Belva Agee MD

## 2020-05-09 ENCOUNTER — Encounter (HOSPITAL_BASED_OUTPATIENT_CLINIC_OR_DEPARTMENT_OTHER): Payer: Self-pay | Admitting: Obstetrics and Gynecology

## 2020-05-09 DIAGNOSIS — N8189 Other female genital prolapse: Secondary | ICD-10-CM | POA: Diagnosis not present

## 2020-05-09 LAB — CBC
HCT: 34.3 % — ABNORMAL LOW (ref 36.0–46.0)
Hemoglobin: 11.2 g/dL — ABNORMAL LOW (ref 12.0–15.0)
MCH: 32.7 pg (ref 26.0–34.0)
MCHC: 32.7 g/dL (ref 30.0–36.0)
MCV: 100.3 fL — ABNORMAL HIGH (ref 80.0–100.0)
Platelets: 304 10*3/uL (ref 150–400)
RBC: 3.42 MIL/uL — ABNORMAL LOW (ref 3.87–5.11)
RDW: 14 % (ref 11.5–15.5)
WBC: 22.1 10*3/uL — ABNORMAL HIGH (ref 4.0–10.5)
nRBC: 0 % (ref 0.0–0.2)

## 2020-05-09 LAB — SURGICAL PATHOLOGY

## 2020-05-09 MED ORDER — ACETAMINOPHEN 500 MG PO TABS
ORAL_TABLET | ORAL | Status: AC
  Filled 2020-05-09: qty 2

## 2020-05-09 MED ORDER — TRAMADOL HCL 50 MG PO TABS
ORAL_TABLET | ORAL | Status: AC
  Filled 2020-05-09: qty 1

## 2020-05-09 MED ORDER — KETOROLAC TROMETHAMINE 30 MG/ML IJ SOLN
INTRAMUSCULAR | Status: AC
  Filled 2020-05-09: qty 1

## 2020-05-09 MED ORDER — IBUPROFEN 600 MG PO TABS: 600.0000 mg | ORAL_TABLET | Freq: Four times a day (QID) | ORAL | 0 refills | Status: AC | PRN

## 2020-05-09 MED ORDER — DOCUSATE SODIUM 100 MG PO CAPS: 100.0000 mg | ORAL_CAPSULE | Freq: Two times a day (BID) | ORAL | 2 refills | Status: AC

## 2020-05-09 MED ORDER — TRAMADOL HCL 50 MG PO TABS
50.0000 mg | ORAL_TABLET | ORAL | 0 refills | Status: AC | PRN
Start: 2020-05-09 — End: ?

## 2020-05-09 NOTE — Discharge Summary (Signed)
Physician Discharge Summary  Patient ID: EMALINE KARNES MRN: [id number] DOB/AGE: [birth year] 54 y.o.  Admit date: 05/08/2020 Discharge date: 05/09/2020  Admission Diagnoses: Pelvic organ prolapse, scheduled surgery  Discharge Diagnoses:  Active Problems:   Pelvic prolapse   S/P hysterectomy   Discharged Condition: good  Hospital Course: Pt is a 54 yo admitted for scheduled above surgery. She Had an uncomplicated TVH, posterior repair, McCall's culcoplasty, and Cystoscopy with an EBL of 50cc. She had an uncomplicated postoperative course and on POD#1 was meeting all goals such as ambulating, passing flatus, voiding freely, and tolerating a general diet and she was deemed stable for discharge home.    Consults: None  Significant Diagnostic Studies: labs:  CBC    Component Value Date/Time   WBC 22.1 (H) 05/09/2020 0634   RBC 3.42 (L) 05/09/2020 0634   HGB 11.2 (L) 05/09/2020 0634   HCT 34.3 (L) 05/09/2020 0634   PLT 304 05/09/2020 0634   MCV 100.3 (H) 05/09/2020 0634   MCH 32.7 05/09/2020 0634   MCHC 32.7 05/09/2020 0634   RDW 14.0 05/09/2020 0634     Treatments: surgery: TVH, posterior repair, McCall's culcoplasty, and Cystoscopy   Discharge Exam: Blood pressure 110/65, pulse 67, temperature 98.1 F (36.7 C), resp. rate 16, height 5\' 6"  (1.676 m), weight 107.2 kg, SpO2 94 %. General appearance: alert Resp: NWOB Cardio: regular rate and rhythm GI: soft, non-tender; bowel sounds normal; no masses,  no organomegaly Pelvic: external genitalia normal and No blood on peripad Extremities: extremities normal, atraumatic, no cyanosis or edema  Disposition: Discharge disposition: 01-Home or Self Care       Discharge Instructions    Call MD for:   Complete by: As directed    Heavy vaginal bleeding or abnormal vaginal discharge   Call MD for:  difficulty breathing, headache or visual disturbances   Complete by: As directed    Call MD for:  persistant nausea and  vomiting   Complete by: As directed    Call MD for:  redness, tenderness, or signs of infection (pain, swelling, redness, odor or green/yellow discharge around incision site)   Complete by: As directed    Call MD for:  severe uncontrolled pain   Complete by: As directed    Call MD for:  temperature >100.4   Complete by: As directed    Diet general   Complete by: As directed    Driving Restrictions   Complete by: As directed    Do not drive until you are off narcotic pain medications and you feel like you can react in an emergency.   Increase activity slowly   Complete by: As directed    Leave dressing on - Keep it clean, dry, and intact until clinic visit   Complete by: As directed    Lifting restrictions   Complete by: As directed    Don't lift anything more than 15-20 pounds   Sexual Activity Restrictions   Complete by: As directed    Nothing in the vagina x 2 to 6 weeks. We will discuss at clinic visit.     Allergies as of 05/09/2020   No Known Allergies     Medication List    TAKE these medications   Anoro Ellipta 62.5-25 MCG/INH Aepb Generic drug: umeclidinium-vilanterol Inhale 1 puff into the lungs daily as needed for shortness of breath.   atorvastatin 40 MG tablet Commonly known as: LIPITOR Take 40 mg by mouth at bedtime.   docusate sodium 100 MG  capsule Commonly known as: Colace Take 1 capsule (100 mg total) by mouth 2 (two) times daily.   DULoxetine 60 MG capsule Commonly known as: CYMBALTA Take 60 mg by mouth daily.   fluticasone 50 MCG/ACT nasal spray Commonly known as: FLONASE Place 1 spray into both nostrils daily as needed for allergies or rhinitis.   ibuprofen 600 MG tablet Commonly known as: ADVIL Take 1 tablet (600 mg total) by mouth every 6 (six) hours as needed.   levothyroxine 75 MCG tablet Commonly known as: SYNTHROID Take 75 mcg by mouth daily before breakfast.   lisinopril 20 MG tablet Commonly known as: ZESTRIL Take 20 mg by mouth  daily.   multivitamin with minerals Tabs tablet Take 1 tablet by mouth daily.   traMADol 50 MG tablet Commonly known as: ULTRAM Take 1 tablet (50 mg total) by mouth every 4 (four) hours as needed for moderate pain.   triamcinolone cream 0.1 % Commonly known as: KENALOG Apply 1 application topically daily as needed (psoriasis).   Vitamin D 50 MCG (2000 UT) Caps Take 2,000 Units by mouth daily.            Discharge Care Instructions  (From admission, onward)         Start     Ordered   05/09/20 0000  Leave dressing on - Keep it clean, dry, and intact until clinic visit        05/09/20 0836           Signed: Ranae Pila 05/09/2020, 8:36 AM

## 2020-05-09 NOTE — Discharge Instructions (Signed)
Vaginal Hysterectomy, Care After Refer to this sheet in the next few weeks. These instructions provide you with information about caring for yourself after your procedure. Your health care provider may also give you more specific instructions. Your treatment has been planned according to current medical practices, but problems sometimes occur. Call your health care provider if you have any problems or questions after your procedure. What can I expect after the procedure? After the procedure, it is common to have:  Pain.  Soreness and numbness in your incision areas.  Vaginal bleeding and discharge.  Constipation.  Temporary problems emptying the bladder.  Feelings of sadness or other emotions. Follow these instructions at home: Medicines  Take over-the-counter and prescription medicines only as told by your health care provider.  If you were prescribed an antibiotic medicine, take it as told by your health care provider. Do not stop taking the antibiotic even if you start to feel better.  Do not drive or operate heavy machinery while taking prescription pain medicine. Activity  Return to your normal activities as told by your health care provider. Ask your health care provider what activities are safe for you.  Get regular exercise as told by your health care provider. You may be told to take short walks every day and go farther each time.  Do not lift anything that is heavier than 10 lb (4.5 kg). General instructions   Do not put anything in your vagina for 6 weeks after your surgery or as told by your health care provider. This includes tampons and douches.  Do not have sex until your health care provider says you can.  Do not take baths, swim, or use a hot tub until your health care provider approves.  Drink enough fluid to keep your urine clear or pale yellow.  Do not drive for 24 hours if you were given a sedative.  Keep all follow-up visits as told by your health  care provider. This is important. Contact a health care provider if:  Your pain medicine is not helping.  You have a fever.  You have redness, swelling, or pain at your incision site.  You have blood, pus, or a bad-smelling discharge from your vagina.  You continue to have difficulty urinating. Get help right away if:  You have severe abdominal or back pain.  You have heavy bleeding from your vagina.  You have chest pain or shortness of breath. This information is not intended to replace advice given to you by your health care provider. Make sure you discuss any questions you have with your health care provider. Document Revised: 04/01/2016 Document Reviewed: 08/24/2015 Elsevier Patient Education  2020 [employer]  

## 2020-05-09 NOTE — Progress Notes (Signed)
Scope patch removed per pt's request.

## 2020-08-01 ENCOUNTER — Emergency Department (HOSPITAL_COMMUNITY): Payer: 59

## 2020-08-01 ENCOUNTER — Inpatient Hospital Stay (HOSPITAL_COMMUNITY)
Admission: EM | Admit: 2020-08-01 | Discharge: 2020-08-10 | DRG: 208 | Disposition: A | Discharge status: Home Health | Payer: 59 | Attending: Family Medicine | Admitting: Family Medicine

## 2020-08-01 DIAGNOSIS — J9601 Acute respiratory failure with hypoxia: Secondary | ICD-10-CM

## 2020-08-01 DIAGNOSIS — I10 Essential (primary) hypertension: Secondary | ICD-10-CM | POA: Diagnosis present

## 2020-08-01 DIAGNOSIS — J189 Pneumonia, unspecified organism: Secondary | ICD-10-CM | POA: Diagnosis not present

## 2020-08-01 DIAGNOSIS — J441 Chronic obstructive pulmonary disease with (acute) exacerbation: Secondary | ICD-10-CM | POA: Diagnosis present

## 2020-08-01 DIAGNOSIS — G928 Other toxic encephalopathy: Secondary | ICD-10-CM

## 2020-08-01 DIAGNOSIS — Z20822 Contact with and (suspected) exposure to covid-19: Secondary | ICD-10-CM | POA: Diagnosis present

## 2020-08-01 DIAGNOSIS — Z79890 Hormone replacement therapy (postmenopausal): Secondary | ICD-10-CM

## 2020-08-01 DIAGNOSIS — R0602 Shortness of breath: Secondary | ICD-10-CM

## 2020-08-01 DIAGNOSIS — R4189 Other symptoms and signs involving cognitive functions and awareness: Secondary | ICD-10-CM

## 2020-08-01 DIAGNOSIS — I469 Cardiac arrest, cause unspecified: Secondary | ICD-10-CM | POA: Diagnosis present

## 2020-08-01 DIAGNOSIS — R197 Diarrhea, unspecified: Secondary | ICD-10-CM | POA: Diagnosis present

## 2020-08-01 DIAGNOSIS — J440 Chronic obstructive pulmonary disease with acute lower respiratory infection: Secondary | ICD-10-CM | POA: Diagnosis present

## 2020-08-01 DIAGNOSIS — Z6841 Body Mass Index (BMI) 40.0 and over, adult: Secondary | ICD-10-CM

## 2020-08-01 DIAGNOSIS — Z79899 Other long term (current) drug therapy: Secondary | ICD-10-CM

## 2020-08-01 DIAGNOSIS — E872 Acidosis: Secondary | ICD-10-CM | POA: Diagnosis present

## 2020-08-01 DIAGNOSIS — J9602 Acute respiratory failure with hypercapnia: Secondary | ICD-10-CM | POA: Diagnosis present

## 2020-08-01 DIAGNOSIS — R17 Unspecified jaundice: Secondary | ICD-10-CM | POA: Diagnosis present

## 2020-08-01 DIAGNOSIS — Z87891 Personal history of nicotine dependence: Secondary | ICD-10-CM

## 2020-08-01 DIAGNOSIS — F32A Depression, unspecified: Secondary | ICD-10-CM | POA: Diagnosis present

## 2020-08-01 DIAGNOSIS — E785 Hyperlipidemia, unspecified: Secondary | ICD-10-CM | POA: Diagnosis present

## 2020-08-01 DIAGNOSIS — Z981 Arthrodesis status: Secondary | ICD-10-CM

## 2020-08-01 DIAGNOSIS — E039 Hypothyroidism, unspecified: Secondary | ICD-10-CM | POA: Diagnosis present

## 2020-08-01 DIAGNOSIS — F419 Anxiety disorder, unspecified: Secondary | ICD-10-CM | POA: Diagnosis present

## 2020-08-01 DIAGNOSIS — E876 Hypokalemia: Secondary | ICD-10-CM | POA: Diagnosis present

## 2020-08-01 DIAGNOSIS — R579 Shock, unspecified: Secondary | ICD-10-CM | POA: Diagnosis present

## 2020-08-01 DIAGNOSIS — R410 Disorientation, unspecified: Secondary | ICD-10-CM

## 2020-08-01 MED ORDER — PROPOFOL 1000 MG/100ML IV EMUL: 5.0000 ug/kg/min | INTRAVENOUS | Status: DC

## 2020-08-01 NOTE — ED Triage Notes (Signed)
Pt transported via [facility] from home, per family pt slumped over while doing dishes, assisted to floor unconscious by family, CPR started by family 7-8 min, vomit in airway noted on EMS arrival, pinpoint pupils, unresponsive. Pt given Narcan 1mg  IN then 1mg  IV. ST 102, RR 3, sat 54%.  Pt placed on CPAP initially, could not tolerate, switched to NRB.

## 2020-08-01 NOTE — ED Provider Notes (Signed)
[facility] EMERGENCY DEPARTMENT Provider Note   CSN: [id number] Arrival date & time: 08/01/20  2340     History Chief Complaint  Patient presents with  . Post CPR    Marie Delacruz is a 54 y.o. female.  The history is provided by the patient and medical records. No language interpreter was used.  Cardiac Arrest Witnessed by:  Family member Incident location:  Home Time before BLS initiated:  Immediate Pulse:  Present Initial cardiac rhythm per EMS:  Normal sinus Treatments prior to arrival:  ACLS protocol Airway:  Bag valve mask and intubation in ED Rhythm on admission to ED:  Unchanged Associated symptoms: vomiting        Past Medical History:  Diagnosis Date  . Anxiety   . COPD (chronic obstructive pulmonary disease) ([place])   . Depression   . HLD (hyperlipidemia)   . HTN (hypertension)   . Hypothyroid   . 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 Obesity   . PONV (postoperative nausea and vomiting)   . Prolapse of posterior vaginal wall     Patient Active Problem List   Diagnosis Date Noted  . Pelvic prolapse 05/08/2020  . S/P hysterectomy 05/08/2020    Past Surgical History:  Procedure Laterality Date  . BACK SURGERY    . CYSTOSCOPY N/A 05/08/2020   Procedure: CYSTOSCOPY;  Surgeon: Tyson Dense, MD;  Location: [facility];  Service: Gynecology;  Laterality: N/A;  . FACIAL COSMETIC SURGERY    . RECTOCELE REPAIR N/A 05/08/2020   Procedure: POSTERIOR REPAIR (RECTOCELE), [place],;  Surgeon: Tyson Dense, MD;  Location: [facility];  Service: Gynecology;  Laterality: N/A;  . SPINAL FUSION    . TUBAL LIGATION    . VAGINAL HYSTERECTOMY N/A 05/08/2020   Procedure: HYSTERECTOMY VAGINAL;  Surgeon: Tyson Dense, MD;  Location: [facility];  Service: Gynecology;  Laterality: N/A;  need bed     OB  History   No obstetric history on file.     No family history on file.  Social History   Tobacco Use  . Smoking status: Former Smoker    Quit date: 12/2019    Years since quitting: 0.6  . Smokeless tobacco: Never Used  Vaping Use  . Vaping Use: Never used  Substance Use Topics  . Alcohol use: Never  . Drug use: Never    Home Medications Prior to Admission medications   Medication Sig Start Date End Date Taking? Authorizing Provider  ANORO ELLIPTA 62.5-25 MCG/INH AEPB Inhale 1 puff into the lungs daily as needed for shortness of breath. 04/03/20   [provider]  atorvastatin (LIPITOR) 40 MG tablet Take 40 mg by mouth at bedtime. 03/24/20   [provider]  Cholecalciferol (VITAMIN D) 50 MCG (2000 UT) CAPS Take 2,000 Units by mouth daily.    [provider]  docusate sodium (COLACE) 100 MG capsule Take 1 capsule (100 mg total) by mouth 2 (two) times daily. 05/09/20   Tyson Dense, MD  DULoxetine (CYMBALTA) 60 MG capsule Take 60 mg by mouth daily. 04/03/20   [provider]  fluticasone (FLONASE) 50 MCG/ACT nasal spray Place 1 spray into both nostrils daily as needed for allergies or rhinitis.    [provider]  ibuprofen (ADVIL) 600 MG tablet Take 1 tablet (600 mg total) by mouth every 6 (  six) hours as needed. 05/09/20   Tyson Dense, MD  levothyroxine (SYNTHROID) 75 MCG tablet Take 75 mcg by mouth daily before breakfast. 03/24/20   [provider]  lisinopril (ZESTRIL) 20 MG tablet Take 20 mg by mouth daily. 01/07/20   [provider]  Multiple Vitamin (MULTIVITAMIN WITH MINERALS) TABS tablet Take 1 tablet by mouth daily.    [provider]  traMADol (ULTRAM) 50 MG tablet Take 1 tablet (50 mg total) by mouth every 4 (four) hours as needed for moderate pain. 05/09/20   Tyson Dense, MD  triamcinolone cream (KENALOG) 0.1 % Apply 1 application topically daily as needed (psoriasis).  01/03/20    [provider]    Allergies    Patient has no known allergies.  Review of Systems   Review of Systems  Unable to perform ROS: Intubated  Gastrointestinal: Positive for vomiting.    Physical Exam Updated Vital Signs BP 134/65 (BP Location: Left Arm)   Pulse (!) 120   Temp (!) 97.1 F (36.2 C) (Oral)   Resp 20   Ht _0  (1.676 m)   Wt 107 kg   SpO2 94%   BMI 38.07 kg/m   Physical Exam Vitals and nursing note reviewed.  Constitutional:      General: She is in acute distress.     Appearance: She is well-developed and well-nourished. She is obese. She is ill-appearing. She is not toxic-appearing or diaphoretic.  HENT:     Head: Normocephalic and atraumatic.     Nose: Nose normal.     Mouth/Throat:     Mouth: Oropharynx is clear and moist. Mucous membranes are moist.     Pharynx: No oropharyngeal exudate or posterior oropharyngeal erythema.  Eyes:     Extraocular Movements: EOM normal.     Conjunctiva/sclera: Conjunctivae normal.     Pupils: Pupils are equal, round, and reactive to light.  Cardiovascular:     Rate and Rhythm: Normal rate.     Pulses: Normal pulses.     Heart sounds: No murmur heard.   Pulmonary:     Effort: Respiratory distress present.     Breath sounds: No stridor. Rhonchi present. No wheezing or rales.  Chest:     Chest wall: No tenderness.  Abdominal:     General: Abdomen is flat. There is no distension.     Tenderness: There is no abdominal tenderness. There is no right CVA tenderness, left CVA tenderness, guarding or rebound.  Musculoskeletal:        General: No tenderness.     Cervical back: Normal range of motion and neck supple.  Skin:    General: Skin is warm.     Findings: No erythema or rash.  Neurological:     Mental Status: She is unresponsive.     GCS: GCS eye subscore is 1. GCS verbal subscore is 1. GCS motor subscore is 1.     Motor: Abnormal muscle tone (not moving to pain) present.     ED Results / Procedures  / Treatments   Labs (all labs ordered are listed, but only abnormal results are displayed) Labs Reviewed  CBC WITH DIFFERENTIAL/PLATELET - Abnormal; Notable for the following components:      Result Value   WBC 21.4 (*)    Neutro Abs 9.8 (*)    Lymphs Abs 9.1 (*)    Monocytes Absolute 1.1 (*)    Eosinophils Absolute 0.9 (*)    Basophils Absolute 0.2 (*)  Abs Immature Granulocytes 0.22 (*)    All other components within normal limits  COMPREHENSIVE METABOLIC PANEL - Abnormal; Notable for the following components:   Potassium 3.4 (*)    Glucose, Bld 292 (*)    Creatinine, Ser 1.09 (*)    GFR, Estimated 60 (*)    All other components within normal limits  LACTIC ACID, PLASMA - Abnormal; Notable for the following components:   Lactic Acid, Venous 3.1 (*)    All other components within normal limits  LIPASE, BLOOD - Abnormal; Notable for the following components:   Lipase 85 (*)    All other components within normal limits  AMMONIA - Abnormal; Notable for the following components:   Ammonia 57 (*)    All other components within normal limits  D-DIMER, QUANTITATIVE (NOT AT [facility]) - Abnormal; Notable for the following components:   D-Dimer, Quant 2.54 (*)    All other components within normal limits  TSH - Abnormal; Notable for the following components:   TSH 9.203 (*)    All other components within normal limits  URINALYSIS, ROUTINE W REFLEX MICROSCOPIC - Abnormal; Notable for the following components:   Color, Urine STRAW (*)    Glucose, UA >=500 (*)    Protein, ur 100 (*)    All other components within normal limits  I-STAT ARTERIAL BLOOD GAS, ED - Abnormal; Notable for the following components:   pH, Arterial 7.251 (*)    pCO2 arterial 67.5 (*)    pO2, Arterial 224 (*)    Bicarbonate 30.3 (*)    TCO2 33 (*)    All other components within normal limits  I-STAT ARTERIAL BLOOD GAS, ED - Abnormal; Notable for the following components:   pH, Arterial 7.166 (*)    pCO2  arterial 83.9 (*)    pO2, Arterial 55 (*)    Bicarbonate 30.6 (*)    TCO2 33 (*)    All other components within normal limits  I-STAT ARTERIAL BLOOD GAS, ED - Abnormal; Notable for the following components:   pH, Arterial 7.162 (*)    pCO2 arterial 83.7 (*)    pO2, Arterial 71 (*)    Bicarbonate 30.3 (*)    TCO2 33 (*)    All other components within normal limits  TROPONIN I (HIGH SENSITIVITY) - Abnormal; Notable for the following components:   Troponin I (High Sensitivity) 23 (*)    All other components within normal limits  RESP PANEL BY RT-PCR (FLU A&B, COVID) ARPGX2  MRSA PCR SCREENING  CULTURE, BLOOD (ROUTINE X 2)  CULTURE, BLOOD (ROUTINE X 2)  URINE CULTURE  CULTURE, RESPIRATORY  LACTIC ACID, PLASMA  RAPID URINE DRUG SCREEN, HOSP PERFORMED  PROTIME-INR  BLOOD GAS, ARTERIAL  BLOOD GAS, ARTERIAL  T4, FREE  PROCALCITONIN  HIV ANTIBODY (ROUTINE TESTING W REFLEX)  CBC  CREATININE, SERUM  LEGIONELLA PNEUMOPHILA SEROGP 1 UR AG  STREP PNEUMONIAE URINARY ANTIGEN  COMPREHENSIVE METABOLIC PANEL  CBC  MAGNESIUM  TROPONIN I (HIGH SENSITIVITY)  TROPONIN I (HIGH SENSITIVITY)    EKG EKG Interpretation  Date/Time:  Friday August 01 2020 23:57:57 EST Ventricular Rate:  99 PR Interval:    QRS Duration: 106 QT Interval:  341 QTC Calculation: 438 R Axis:   84 Text Interpretation: Sinus rhythm Borderline repolarization abnormality when compared to prior, faster rate. No STEMI Confirmed by Antony Blackbird [phone]) on 08/02/2020 12:01:04 AM   Radiology CT Head Wo Contrast  Result Date: 08/02/2020 CLINICAL DATA:  Mental status changes EXAM: CT  HEAD WITHOUT CONTRAST TECHNIQUE: Contiguous axial images were obtained from the base of the skull through the vertex without intravenous contrast. COMPARISON:  None. FINDINGS: Brain: No acute intracranial abnormality. Specifically, no hemorrhage, hydrocephalus, mass lesion, acute infarction, or significant intracranial injury. Vascular: No  hyperdense vessel or unexpected calcification. Skull: No acute calvarial abnormality. Sinuses/Orbits: Air-fluid levels in the sphenoid sinuses. Other: None IMPRESSION: No intracranial abnormality. Acute sphenoid sinusitis. Electronically Signed   By: Rolm Baptise M.D.   On: 08/02/2020 00:29   CT Angio Chest PE W and/or Wo Contrast  Result Date: 08/02/2020 CLINICAL DATA:  Positive D-dimer.  Pulmonary embolism suspected. EXAM: CT ANGIOGRAPHY CHEST WITH CONTRAST TECHNIQUE: Multidetector CT imaging of the chest was performed using the standard protocol during bolus administration of intravenous contrast. Multiplanar CT image reconstructions and MIPs were obtained to evaluate the vascular anatomy. CONTRAST:  165m OMNIPAQUE IOHEXOL 350 MG/ML SOLN COMPARISON:  None. FINDINGS: Cardiovascular: Satisfactory contrast bolus. Beam attenuation caused by patient body habitus limits assessment beyond the proximal segmental level. There is no pulmonary embolus or evidence of right heart strain. The size of the main pulmonary artery is normal. Heart size is normal, with no pericardial effusion. The course and caliber of the aorta are normal. There is no atherosclerotic calcification. Opacification decreased due to pulmonary arterial phase contrast bolus timing. Mediastinum/Nodes: -- No mediastinal lymphadenopathy. -- No hilar lymphadenopathy. -- No axillary lymphadenopathy. -- No supraclavicular lymphadenopathy. -- Normal thyroid gland where visualized. -  Unremarkable esophagus. Lungs/Pleura: Hazy bilateral ground-glass airspace opacities are noted. The trachea terminates above the carina. There is no pneumothorax. No large pleural effusion. There is a trace right-sided pleural effusion. Upper Abdomen: Contrast bolus timing is not optimized for evaluation of the abdominal organs. The enteric tube extends into the stomach. The tip is not visualized on this study. Musculoskeletal: No chest wall abnormality. No bony spinal canal  stenosis. Review of the MIP images confirms the above findings. IMPRESSION: 1. No acute pulmonary embolism. 2. Hazy bilateral ground-glass airspace opacities, concerning for an atypical infectious process such as viral pneumonia or developing pulmonary edema. 3. Trace right-sided pleural effusion. Electronically Signed   By: CConstance HolsterM.D.   On: 08/02/2020 02:01   DG Chest Portable 1 View  Result Date: 08/02/2020 CLINICAL DATA:  Intubation EXAM: PORTABLE CHEST 1 VIEW COMPARISON:  01/14/2014 FINDINGS: endotracheal tube 4.7 cm above the carina. NG tube is in the stomach. Heart is normal size. No confluent opacities or effusions. No acute bony abnormality. IMPRESSION: Endotracheal tube 4.7 cm above the carina. No acute cardiopulmonary disease. Electronically Signed   By: KRolm BaptiseM.D.   On: 08/02/2020 00:16    Procedures Procedure Name: Intubation Date/Time: 08/02/2020 12:11 AM Performed by: TCourtney Paris MD Pre-anesthesia Checklist: Patient identified, Emergency Drugs available, Suction available, Patient being monitored and Timeout performed Oxygen Delivery Method: Non-rebreather mask Preoxygenation: Pre-oxygenation with 100% oxygen Induction Type: Rapid sequence Laryngoscope Size: Glidescope Grade View: Grade I Tube size: 7.5 mm Number of attempts: 1 Placement Confirmation: ETT inserted through vocal cords under direct vision,  Positive ETCO2 and Breath sounds checked- equal and bilateral Tube secured with: ETT holder Dental Injury: Teeth and Oropharynx as per pre-operative assessment       (including critical care time)  CRITICAL CARE Performed by: CGwenyth AllegraTegeler Total critical care time: 35 minutes Critical care time was exclusive of separately billable procedures and treating other patients. Critical care was necessary to treat or prevent imminent or life-threatening deterioration. Critical care  was time spent personally by me on the following  activities: development of treatment plan with patient and/or surrogate as well as nursing, discussions with consultants, evaluation of patient's response to treatment, examination of patient, obtaining history from patient or surrogate, ordering and performing treatments and interventions, ordering and review of laboratory studies, ordering and review of radiographic studies, pulse oximetry and re-evaluation of patient's condition.   Medications Ordered in ED Medications  propofol (DIPRIVAN) 1000 MG/100ML infusion (0 mcg/kg/min  107 kg Intravenous Stopped 08/02/20 0349)  ipratropium-albuterol (DUONEB) 0.5-2.5 (3) MG/3ML nebulizer solution 3 mL (3 mLs Nebulization Given 08/02/20 0258)  piperacillin-tazobactam (ZOSYN) IVPB 3.375 g (has no administration in time range)  vancomycin (VANCOREADY) IVPB 750 mg/150 mL (has no administration in time range)  methylPREDNISolone sodium succinate (SOLU-MEDROL) 125 mg/2 mL injection 80 mg (80 mg Intravenous Given 08/02/20 0604)  ipratropium-albuterol (DUONEB) 0.5-2.5 (3) MG/3ML nebulizer solution 3 mL (has no administration in time range)  azithromycin (ZITHROMAX) 500 mg in sodium chloride 0.9 % 250 mL IVPB ( Intravenous Stopped 08/02/20 0558)  midazolam (VERSED) 50 mg/50 mL (1 mg/mL) premix infusion (6 mg/hr Intravenous Infusion Verify 08/02/20 0600)  fentaNYL 2553mg in NS 252m(1020mml) infusion-PREMIX (200 mcg/hr Intravenous Infusion Verify 08/02/20 0600)  docusate sodium (COLACE) capsule 100 mg (has no administration in time range)  polyethylene glycol (MIRALAX / GLYCOLAX) packet 17 g (has no administration in time range)  enoxaparin (LOVENOX) injection 40 mg (40 mg Subcutaneous Given 08/02/20 0459)  ondansetron (ZOFRAN) injection 4 mg (has no administration in time range)  pantoprazole (PROTONIX) injection 40 mg (has no administration in time range)  Chlorhexidine Gluconate Cloth 2 % PADS 6 each (6 each Topical Given 08/02/20 0445)  chlorhexidine  gluconate (MEDLINE KIT) (PERIDEX) 0.12 % solution 15 mL (has no administration in time range)  MEDLINE mouth rinse (15 mLs Mouth Rinse Given 08/02/20 0611)  norepinephrine (LEVOPHED) 4mg51m 250mL53mmix infusion (4 mcg/min Intravenous Infusion Verify 08/02/20 0600)  atorvastatin (LIPITOR) tablet 40 mg (has no administration in time range)  levothyroxine (SYNTHROID) tablet 75 mcg (75 mcg Per NG tube Given 08/02/20 0604)  iohexol (OMNIPAQUE) 350 MG/ML injection 100 mL (100 mLs Intravenous Contrast Given 08/02/20 0141)  piperacillin-tazobactam (ZOSYN) IVPB 3.375 g (0 g Intravenous Stopped 08/02/20 0411)  vancomycin (VANCOREADY) IVPB 2000 mg/400 mL (0 mg Intravenous Stopped 08/02/20 0544)  ipratropium-albuterol (DUONEB) 0.5-2.5 (3) MG/3ML nebulizer solution 3 mL (3 mLs Nebulization Given 08/02/20 0509)  methylPREDNISolone sodium succinate (SOLU-MEDROL) 125 mg/2 mL injection 80 mg (80 mg Intravenous Given 08/02/20 0500)  rocuronium (ZEMURON) injection 50 mg (0 mg Intravenous Duplicate 07/1130/54/00)8676curonium bromide 100 MG/10ML SOSY (50 mg  Given 08/02/20 0529)1950ED Course  I have reviewed the triage vital signs and the nursing notes.  Pertinent labs & imaging results that were available during my care of the patient were reviewed by me and considered in my medical decision making (see chart for details).    MDM Rules/Calculators/A&P                          HeathASA FATH 54 y.88 female with a past medical history significant for COPD, hypertension, hyperlipidemia, depression, anxiety, obesity, hypothyroidism, and documentation showing prior mild oxycodone use disorder in remission who presents as a postarrest.  According to EMS, patient was doing dishes when she slumped over and CPR was performed by family for around 7 to 8 minutes with vomiting in the  airway.  EMS found the patient unresponsive with pinpoint pupils so they gave Narcan.  Patient reportedly started to breathe better and  wake up but still had very poor breathing.  Oxygen saturations were reportedly 54% on room air on their arrival.  Patient was tried on CPAP but she did not tolerate it.  Patient arrived on a nonrebreather.  On arrival, patient GCS is 3.  She is not moving extremities to pain.  Patient was breathing very slowly and had very coarse breath sounds.  Decision made to intubate for airway protection and due to some concern for aspiration that is ongoing.  Patient intubated without difficulty with RSI.  Postintubation x-ray shows no evidence of pneumothorax and appears to show proper placement of the ET tube OG tube.  On further exam, patient does not have evidence of trauma.  Patient did not appear to have tenderness in her abdomen or pelvis or extremities.  We will get labs including D-dimer given the respiratory failure and unresponsiveness and cardiac arrest.  We will get a CT head and other labs.  Will call critical care for admission work-up is completed.  Initial EKG does not show STEMI.  Anticipate admission after work-up.     1:26 AM I just spoke to the patient's husband who is able provide further summation.  He does report that she has had some cough and shortness breath for the last few days but was denying any chest pain today.  He says that she had just gotten home from work and had been around the house around 20 minutes when she was at the sink when she had this episode where she had a coughing fit and then went unresponsive.  He reportedly was able to get her into a chair and lay her down without trauma but she was unresponsive and he thought she might be "choking on her own tongue".  He denies she is ever had history of seizures and is not sure there was something like a seizure going on but he reports she may have shaken for a second or 2.  He then started CPR at the direction of 911 until EMS arrived.  Patient's work-up returned and did show a leukocytosis and elevated lactic acid.  CT  of the chest was performed showing no evidence of PE but did show some airspace opacities.  We will treat with antibiotics for possible pneumonia or aspiration.  CT head did not show acute intracranial abnormality other than some sinusitis.  UDS negative.  Still unclear etiology the patient's altered mental status and somnolence but as she is intubated, she will be admitted to ICU team.  Patient admitted for further management respiratory failure and postarrest.   Final Clinical Impression(s) / ED Diagnoses Final diagnoses:  Unresponsive  Acute respiratory failure with hypoxia and hypercapnia (HCC)     Clinical Impression: 1. Unresponsive   2. Acute respiratory failure with hypoxia and hypercapnia (HCC)     Disposition: Admit  This note was prepared with assistance of Dragon voice recognition software. Occasional wrong-word or sound-a-like substitutions may have occurred due to the inherent limitations of voice recognition software.     Lorijean Husser, Gwenyth Allegra, MD 08/02/20 [phone]

## 2020-08-02 ENCOUNTER — Inpatient Hospital Stay (HOSPITAL_COMMUNITY): Payer: 59

## 2020-08-02 ENCOUNTER — Emergency Department (HOSPITAL_COMMUNITY): Payer: 59

## 2020-08-02 ENCOUNTER — Encounter (HOSPITAL_COMMUNITY): Payer: Self-pay | Admitting: Emergency Medicine

## 2020-08-02 DIAGNOSIS — J9602 Acute respiratory failure with hypercapnia: Secondary | ICD-10-CM

## 2020-08-02 DIAGNOSIS — R197 Diarrhea, unspecified: Secondary | ICD-10-CM | POA: Diagnosis present

## 2020-08-02 DIAGNOSIS — I469 Cardiac arrest, cause unspecified: Secondary | ICD-10-CM | POA: Diagnosis present

## 2020-08-02 DIAGNOSIS — R4189 Other symptoms and signs involving cognitive functions and awareness: Secondary | ICD-10-CM | POA: Diagnosis present

## 2020-08-02 DIAGNOSIS — Z20822 Contact with and (suspected) exposure to covid-19: Secondary | ICD-10-CM | POA: Diagnosis present

## 2020-08-02 DIAGNOSIS — Z6841 Body Mass Index (BMI) 40.0 and over, adult: Secondary | ICD-10-CM | POA: Diagnosis not present

## 2020-08-02 DIAGNOSIS — J9601 Acute respiratory failure with hypoxia: Secondary | ICD-10-CM | POA: Diagnosis present

## 2020-08-02 DIAGNOSIS — F419 Anxiety disorder, unspecified: Secondary | ICD-10-CM | POA: Diagnosis present

## 2020-08-02 DIAGNOSIS — R410 Disorientation, unspecified: Secondary | ICD-10-CM | POA: Diagnosis not present

## 2020-08-02 DIAGNOSIS — Z79899 Other long term (current) drug therapy: Secondary | ICD-10-CM | POA: Diagnosis not present

## 2020-08-02 DIAGNOSIS — J441 Chronic obstructive pulmonary disease with (acute) exacerbation: Secondary | ICD-10-CM | POA: Diagnosis present

## 2020-08-02 DIAGNOSIS — E785 Hyperlipidemia, unspecified: Secondary | ICD-10-CM | POA: Diagnosis present

## 2020-08-02 DIAGNOSIS — R17 Unspecified jaundice: Secondary | ICD-10-CM | POA: Diagnosis present

## 2020-08-02 DIAGNOSIS — I10 Essential (primary) hypertension: Secondary | ICD-10-CM | POA: Diagnosis present

## 2020-08-02 DIAGNOSIS — I509 Heart failure, unspecified: Secondary | ICD-10-CM

## 2020-08-02 DIAGNOSIS — Z981 Arthrodesis status: Secondary | ICD-10-CM | POA: Diagnosis not present

## 2020-08-02 DIAGNOSIS — E872 Acidosis: Secondary | ICD-10-CM | POA: Diagnosis not present

## 2020-08-02 DIAGNOSIS — J189 Pneumonia, unspecified organism: Secondary | ICD-10-CM | POA: Diagnosis present

## 2020-08-02 DIAGNOSIS — Z87891 Personal history of nicotine dependence: Secondary | ICD-10-CM | POA: Diagnosis not present

## 2020-08-02 DIAGNOSIS — F32A Depression, unspecified: Secondary | ICD-10-CM | POA: Diagnosis present

## 2020-08-02 DIAGNOSIS — R579 Shock, unspecified: Secondary | ICD-10-CM | POA: Diagnosis not present

## 2020-08-02 DIAGNOSIS — E039 Hypothyroidism, unspecified: Secondary | ICD-10-CM | POA: Diagnosis present

## 2020-08-02 DIAGNOSIS — E876 Hypokalemia: Secondary | ICD-10-CM | POA: Diagnosis present

## 2020-08-02 DIAGNOSIS — G928 Other toxic encephalopathy: Secondary | ICD-10-CM | POA: Diagnosis present

## 2020-08-02 DIAGNOSIS — J440 Chronic obstructive pulmonary disease with acute lower respiratory infection: Secondary | ICD-10-CM | POA: Diagnosis not present

## 2020-08-02 LAB — I-STAT ARTERIAL BLOOD GAS, ED
Acid-Base Excess: 0 mmol/L (ref 0.0–2.0)
Acid-base deficit: 1 mmol/L (ref 0.0–2.0)
Acid-base deficit: 1 mmol/L (ref 0.0–2.0)
Bicarbonate: 30.3 mmol/L — ABNORMAL HIGH (ref 20.0–28.0)
Bicarbonate: 30.3 mmol/L — ABNORMAL HIGH (ref 20.0–28.0)
Bicarbonate: 30.6 mmol/L — ABNORMAL HIGH (ref 20.0–28.0)
Calcium, Ion: 1.33 mmol/L (ref 1.15–1.40)
Calcium, Ion: 1.33 mmol/L (ref 1.15–1.40)
Calcium, Ion: 1.36 mmol/L (ref 1.15–1.40)
HCT: 42 % (ref 36.0–46.0)
HCT: 43 % (ref 36.0–46.0)
HCT: 43 % (ref 36.0–46.0)
Hemoglobin: 14.3 g/dL (ref 12.0–15.0)
Hemoglobin: 14.6 g/dL (ref 12.0–15.0)
Hemoglobin: 14.6 g/dL (ref 12.0–15.0)
O2 Saturation: 100 %
O2 Saturation: 79 %
O2 Saturation: 89 %
Patient temperature: 95.3
Patient temperature: 97.2
Patient temperature: 97.2
Potassium: 3.6 mmol/L (ref 3.5–5.1)
Potassium: 3.7 mmol/L (ref 3.5–5.1)
Potassium: 3.7 mmol/L (ref 3.5–5.1)
Sodium: 136 mmol/L (ref 135–145)
Sodium: 138 mmol/L (ref 135–145)
Sodium: 139 mmol/L (ref 135–145)
TCO2: 33 mmol/L — ABNORMAL HIGH (ref 22–32)
TCO2: 33 mmol/L — ABNORMAL HIGH (ref 22–32)
TCO2: 33 mmol/L — ABNORMAL HIGH (ref 22–32)
pCO2 arterial: 67.5 mmHg (ref 32.0–48.0)
pCO2 arterial: 83.7 mmHg (ref 32.0–48.0)
pCO2 arterial: 83.9 mmHg (ref 32.0–48.0)
pH, Arterial: 7.162 — CL (ref 7.350–7.450)
pH, Arterial: 7.166 — CL (ref 7.350–7.450)
pH, Arterial: 7.251 — ABNORMAL LOW (ref 7.350–7.450)
pO2, Arterial: 224 mmHg — ABNORMAL HIGH (ref 83.0–108.0)
pO2, Arterial: 55 mmHg — ABNORMAL LOW (ref 83.0–108.0)
pO2, Arterial: 71 mmHg — ABNORMAL LOW (ref 83.0–108.0)

## 2020-08-02 LAB — RESPIRATORY PANEL BY PCR

## 2020-08-02 LAB — POCT I-STAT 7, (LYTES, BLD GAS, ICA,H+H)
Acid-base deficit: 1 mmol/L (ref 0.0–2.0)
Acid-base deficit: 1 mmol/L (ref 0.0–2.0)
Acid-base deficit: 2 mmol/L (ref 0.0–2.0)
Bicarbonate: 31.3 mmol/L — ABNORMAL HIGH (ref 20.0–28.0)
Bicarbonate: 28.6 mmol/L — ABNORMAL HIGH (ref 20.0–28.0)
Bicarbonate: 26.5 mmol/L (ref 20.0–28.0)
Calcium, Ion: 1.26 mmol/L (ref 1.15–1.40)
Calcium, Ion: 1.25 mmol/L (ref 1.15–1.40)
Calcium, Ion: 1.24 mmol/L (ref 1.15–1.40)
HCT: 39 % (ref 36.0–46.0)
HCT: 42 % (ref 36.0–46.0)
HCT: 42 % (ref 36.0–46.0)
Hemoglobin: 13.3 g/dL (ref 12.0–15.0)
Hemoglobin: 14.3 g/dL (ref 12.0–15.0)
Hemoglobin: 14.3 g/dL (ref 12.0–15.0)
O2 Saturation: 99 %
O2 Saturation: 87 %
O2 Saturation: 94 %
Patient temperature: 37.2
Patient temperature: 37.5
Patient temperature: 37.8
Potassium: 3.8 mmol/L (ref 3.5–5.1)
Potassium: 3.9 mmol/L (ref 3.5–5.1)
Potassium: 4.2 mmol/L (ref 3.5–5.1)
Sodium: 135 mmol/L (ref 135–145)
Sodium: 138 mmol/L (ref 135–145)
Sodium: 137 mmol/L (ref 135–145)
TCO2: 34 mmol/L — ABNORMAL HIGH (ref 22–32)
TCO2: 31 mmol/L (ref 22–32)
TCO2: 28 mmol/L (ref 22–32)
pCO2 arterial: 94.8 mmHg (ref 32.0–48.0)
pCO2 arterial: 74.8 mmHg (ref 32.0–48.0)
pCO2 arterial: 64.2 mmHg — ABNORMAL HIGH (ref 32.0–48.0)
pH, Arterial: 7.128 — CL (ref 7.350–7.450)
pH, Arterial: 7.193 — CL (ref 7.350–7.450)
pH, Arterial: 7.228 — ABNORMAL LOW (ref 7.350–7.450)
pO2, Arterial: 215 mmHg — ABNORMAL HIGH (ref 83.0–108.0)
pO2, Arterial: 69 mmHg — ABNORMAL LOW (ref 83.0–108.0)
pO2, Arterial: 87 mmHg (ref 83.0–108.0)

## 2020-08-02 LAB — HEMOGLOBIN A1C
Hgb A1c MFr Bld: 5.8 % — ABNORMAL HIGH (ref 4.8–5.6)
Mean Plasma Glucose: 119.76 mg/dL

## 2020-08-02 LAB — URINALYSIS, ROUTINE W REFLEX MICROSCOPIC
Bacteria, UA: NONE SEEN
Bilirubin Urine: NEGATIVE
Glucose, UA: >=500 mg/dL — AB
Hgb urine dipstick: NEGATIVE
Ketones, ur: NEGATIVE mg/dL
Leukocytes,Ua: NEGATIVE
Nitrite: NEGATIVE
Protein, ur: 100 mg/dL — AB
Specific Gravity, Urine: 1.009 (ref 1.005–1.030)
pH: 6 (ref 5.0–8.0)

## 2020-08-02 LAB — D-DIMER, QUANTITATIVE: D-Dimer, Quant: 2.54 ug/mL-FEU — ABNORMAL HIGH (ref 0.00–0.50)

## 2020-08-02 LAB — CBC WITH DIFFERENTIAL/PLATELET
Abs Immature Granulocytes: 0.22 10*3/uL — ABNORMAL HIGH (ref 0.00–0.07)
Basophils Absolute: 0.2 10*3/uL — ABNORMAL HIGH (ref 0.0–0.1)
Basophils Relative: 1 %
Eosinophils Absolute: 0.9 10*3/uL — ABNORMAL HIGH (ref 0.0–0.5)
Eosinophils Relative: 4 %
HCT: 44.3 % (ref 36.0–46.0)
Hemoglobin: 13.9 g/dL (ref 12.0–15.0)
Immature Granulocytes: 1 %
Lymphocytes Relative: 43 %
Lymphs Abs: 9.1 10*3/uL — ABNORMAL HIGH (ref 0.7–4.0)
MCH: 31.4 pg (ref 26.0–34.0)
MCHC: 31.4 g/dL (ref 30.0–36.0)
MCV: 100 fL (ref 80.0–100.0)
Monocytes Absolute: 1.1 10*3/uL — ABNORMAL HIGH (ref 0.1–1.0)
Monocytes Relative: 5 %
Neutro Abs: 9.8 10*3/uL — ABNORMAL HIGH (ref 1.7–7.7)
Neutrophils Relative %: 46 %
Platelets: 380 10*3/uL (ref 150–400)
RBC: 4.43 MIL/uL (ref 3.87–5.11)
RDW: 13.5 % (ref 11.5–15.5)
WBC: 21.4 10*3/uL — ABNORMAL HIGH (ref 4.0–10.5)
nRBC: 0.1 % (ref 0.0–0.2)

## 2020-08-02 LAB — RAPID URINE DRUG SCREEN, HOSP PERFORMED
Amphetamines: NOT DETECTED
Barbiturates: NOT DETECTED
Benzodiazepines: NOT DETECTED
Cocaine: NOT DETECTED
Opiates: NOT DETECTED
Tetrahydrocannabinol: NOT DETECTED

## 2020-08-02 LAB — LACTIC ACID, PLASMA
Lactic Acid, Venous: 3.1 mmol/L (ref 0.5–1.9)
Lactic Acid, Venous: 1.6 mmol/L (ref 0.5–1.9)

## 2020-08-02 LAB — COMPREHENSIVE METABOLIC PANEL
ALT: 26 U/L (ref 0–44)
ALT: 25 U/L (ref 0–44)
AST: 27 U/L (ref 15–41)
AST: 25 U/L (ref 15–41)
Albumin: 4.3 g/dL (ref 3.5–5.0)
Albumin: 3.8 g/dL (ref 3.5–5.0)
Alkaline Phosphatase: 119 U/L (ref 38–126)
Alkaline Phosphatase: 92 U/L (ref 38–126)
Anion gap: 10 (ref 5–15)
Anion gap: 15 (ref 5–15)
BUN: 15 mg/dL (ref 6–20)
BUN: 19 mg/dL (ref 6–20)
CO2: 27 mmol/L (ref 22–32)
CO2: 22 mmol/L (ref 22–32)
Calcium: 9.8 mg/dL (ref 8.9–10.3)
Calcium: 9 mg/dL (ref 8.9–10.3)
Chloride: 98 mmol/L (ref 98–111)
Chloride: 103 mmol/L (ref 98–111)
Creatinine, Ser: 1.09 mg/dL — ABNORMAL HIGH (ref 0.44–1.00)
Creatinine, Ser: 1.33 mg/dL — ABNORMAL HIGH (ref 0.44–1.00)
GFR, Estimated: 60 mL/min — ABNORMAL LOW (ref >60)
GFR, Estimated: 47 mL/min — ABNORMAL LOW (ref >60)
Glucose, Bld: 292 mg/dL — ABNORMAL HIGH (ref 70–99)
Glucose, Bld: 153 mg/dL — ABNORMAL HIGH (ref 70–99)
Potassium: 3.4 mmol/L — ABNORMAL LOW (ref 3.5–5.1)
Potassium: 4 mmol/L (ref 3.5–5.1)
Sodium: 135 mmol/L (ref 135–145)
Sodium: 140 mmol/L (ref 135–145)
Total Bilirubin: 0.7 mg/dL (ref 0.3–1.2)
Total Bilirubin: 0.8 mg/dL (ref 0.3–1.2)
Total Protein: 7.5 g/dL (ref 6.5–8.1)
Total Protein: 6.5 g/dL (ref 6.5–8.1)

## 2020-08-02 LAB — RESP PANEL BY RT-PCR (FLU A&B, COVID) ARPGX2
Influenza A by PCR: NEGATIVE
Influenza B by PCR: NEGATIVE
SARS Coronavirus 2 by RT PCR: NEGATIVE

## 2020-08-02 LAB — GLUCOSE, CAPILLARY
Glucose-Capillary: 135 mg/dL — ABNORMAL HIGH (ref 70–99)
Glucose-Capillary: 164 mg/dL — ABNORMAL HIGH (ref 70–99)
Glucose-Capillary: 170 mg/dL — ABNORMAL HIGH (ref 70–99)
Glucose-Capillary: 203 mg/dL — ABNORMAL HIGH (ref 70–99)
Glucose-Capillary: 211 mg/dL — ABNORMAL HIGH (ref 70–99)
Glucose-Capillary: 222 mg/dL — ABNORMAL HIGH (ref 70–99)

## 2020-08-02 LAB — ECHOCARDIOGRAM COMPLETE
AR max vel: 2.34 cm2
AV Area VTI: 2.38 cm2
AV Area mean vel: 2.43 cm2
AV Mean grad: 4 mmHg
AV Peak grad: 8.4 mmHg
Ao pk vel: 1.45 m/s
Area-P 1/2: 4.06 cm2
Height: 66 in
Weight: 3918.9 oz

## 2020-08-02 LAB — CBC
HCT: 42.5 % (ref 36.0–46.0)
Hemoglobin: 13.1 g/dL (ref 12.0–15.0)
MCH: 31 pg (ref 26.0–34.0)
MCHC: 30.8 g/dL (ref 30.0–36.0)
MCV: 100.7 fL — ABNORMAL HIGH (ref 80.0–100.0)
Platelets: 319 10*3/uL (ref 150–400)
RBC: 4.22 MIL/uL (ref 3.87–5.11)
RDW: 13.9 % (ref 11.5–15.5)
WBC: 24.4 10*3/uL — ABNORMAL HIGH (ref 4.0–10.5)
nRBC: 0 % (ref 0.0–0.2)

## 2020-08-02 LAB — TROPONIN I (HIGH SENSITIVITY)
Troponin I (High Sensitivity): 7 ng/L (ref <18)
Troponin I (High Sensitivity): 23 ng/L — ABNORMAL HIGH (ref <18)
Troponin I (High Sensitivity): 18 ng/L — ABNORMAL HIGH (ref <18)

## 2020-08-02 LAB — PROTIME-INR
INR: 1 (ref 0.8–1.2)
Prothrombin Time: 12.4 seconds (ref 11.4–15.2)

## 2020-08-02 LAB — LIPASE, BLOOD: Lipase: 85 U/L — ABNORMAL HIGH (ref 11–51)

## 2020-08-02 LAB — T4, FREE: Free T4: 0.66 ng/dL (ref 0.61–1.12)

## 2020-08-02 LAB — TSH: TSH: 9.203 u[IU]/mL — ABNORMAL HIGH (ref 0.350–4.500)

## 2020-08-02 LAB — MAGNESIUM: Magnesium: 1.8 mg/dL (ref 1.7–2.4)

## 2020-08-02 LAB — AMMONIA: Ammonia: 57 umol/L — ABNORMAL HIGH (ref 9–35)

## 2020-08-02 LAB — PROCALCITONIN: Procalcitonin: 6.64 ng/mL

## 2020-08-02 LAB — MRSA PCR SCREENING: MRSA by PCR: NEGATIVE

## 2020-08-02 LAB — STREP PNEUMONIAE URINARY ANTIGEN: Strep Pneumo Urinary Antigen: NEGATIVE

## 2020-08-02 LAB — HIV ANTIBODY (ROUTINE TESTING W REFLEX): HIV Screen 4th Generation wRfx: NONREACTIVE

## 2020-08-02 MED ORDER — ATORVASTATIN CALCIUM 40 MG PO TABS
40.0000 mg | ORAL_TABLET | Freq: Every day | ORAL | Status: DC
  Administered 2020-08-02: 10:00:00 40 mg via NASOGASTRIC
  Filled 2020-08-02: qty 1

## 2020-08-02 MED ORDER — DEXMEDETOMIDINE HCL IN NACL 400 MCG/100ML IV SOLN
0.4000 ug/kg/h | INTRAVENOUS | Status: DC
  Administered 2020-08-02: 11:00:00 0.4 ug/kg/h via INTRAVENOUS
  Administered 2020-08-03: 21:00:00 1.2 ug/kg/h via INTRAVENOUS
  Administered 2020-08-03: 16:00:00 0.8 ug/kg/h via INTRAVENOUS
  Administered 2020-08-03: 09:00:00 0.4 ug/kg/h via INTRAVENOUS
  Administered 2020-08-03: 12:00:00 1.2 ug/kg/h via INTRAVENOUS
  Administered 2020-08-04: 03:00:00 1.5 ug/kg/h via INTRAVENOUS
  Administered 2020-08-04: 06:00:00 1.4 ug/kg/h via INTRAVENOUS
  Administered 2020-08-04: 19:00:00 1.6 ug/kg/h via INTRAVENOUS
  Administered 2020-08-04: 01:00:00 1.4 ug/kg/h via INTRAVENOUS
  Administered 2020-08-04: 16:00:00 1.6 ug/kg/h via INTRAVENOUS
  Administered 2020-08-04: 09:00:00 0.8 ug/kg/h via INTRAVENOUS
  Administered 2020-08-04: 23:00:00 1.2 ug/kg/h via INTRAVENOUS
  Administered 2020-08-04: 13:00:00 1 ug/kg/h via INTRAVENOUS
  Administered 2020-08-05 (×3): 1.6 ug/kg/h via INTRAVENOUS
  Administered 2020-08-05: 15:00:00 1.2 ug/kg/h via INTRAVENOUS
  Administered 2020-08-05 (×2): 1.6 ug/kg/h via INTRAVENOUS
  Administered 2020-08-05: 20:00:00 0.72 ug/kg/h via INTRAVENOUS
  Administered 2020-08-06 (×2): 1.2 ug/kg/h via INTRAVENOUS
  Filled 2020-08-02: qty 100
  Filled 2020-08-02: qty 200
  Filled 2020-08-02 (×9): qty 100
  Filled 2020-08-02: qty 200
  Filled 2020-08-02: qty 100
  Filled 2020-08-02: qty 200
  Filled 2020-08-02 (×9): qty 100

## 2020-08-02 MED ORDER — ROCURONIUM BROMIDE 50 MG/5ML IV SOLN
50.0000 mg | Freq: Once | INTRAVENOUS | Status: AC
  Filled 2020-08-02: qty 5

## 2020-08-02 MED ORDER — VANCOMYCIN HCL 2000 MG/400ML IV SOLN
2000.0000 mg | Freq: Once | INTRAVENOUS | Status: AC
  Administered 2020-08-02: 03:00:00 2000 mg via INTRAVENOUS
  Filled 2020-08-02: qty 400

## 2020-08-02 MED ORDER — SODIUM CHLORIDE 0.9 % IV SOLN
500.0000 mg | INTRAVENOUS | Status: AC
  Administered 2020-08-02 – 2020-08-06 (×5): 500 mg via INTRAVENOUS
  Filled 2020-08-02 (×5): qty 500

## 2020-08-02 MED ORDER — PERFLUTREN LIPID MICROSPHERE
1.0000 mL | INTRAVENOUS | Status: AC | PRN
  Administered 2020-08-02: 09:00:00 3 mL via INTRAVENOUS
  Filled 2020-08-02: qty 10

## 2020-08-02 MED ORDER — PROPOFOL 1000 MG/100ML IV EMUL
INTRAVENOUS | Status: AC
  Filled 2020-08-02: qty 100

## 2020-08-02 MED ORDER — PIPERACILLIN-TAZOBACTAM 3.375 G IVPB
3.3750 g | Freq: Three times a day (TID) | INTRAVENOUS | Status: DC
  Administered 2020-08-02 – 2020-08-04 (×6): 3.375 g via INTRAVENOUS
  Filled 2020-08-02 (×7): qty 50

## 2020-08-02 MED ORDER — ROCURONIUM BROMIDE 10 MG/ML (PF) SYRINGE
PREFILLED_SYRINGE | INTRAVENOUS | Status: AC
  Administered 2020-08-02: 05:00:00 50 mg
  Filled 2020-08-02: qty 10

## 2020-08-02 MED ORDER — INSULIN ASPART 100 UNIT/ML ~~LOC~~ SOLN
0.0000 [IU] | SUBCUTANEOUS | Status: DC
  Administered 2020-08-02: 21:00:00 5 [IU] via SUBCUTANEOUS
  Administered 2020-08-02 – 2020-08-03 (×2): 3 [IU] via SUBCUTANEOUS
  Administered 2020-08-03: 2 [IU] via SUBCUTANEOUS
  Administered 2020-08-03: 17:00:00 3 [IU] via SUBCUTANEOUS
  Administered 2020-08-03 (×2): 2 [IU] via SUBCUTANEOUS
  Administered 2020-08-03: 04:00:00 5 [IU] via SUBCUTANEOUS
  Administered 2020-08-04 (×5): 2 [IU] via SUBCUTANEOUS
  Administered 2020-08-05: 16:00:00 3 [IU] via SUBCUTANEOUS
  Administered 2020-08-05 – 2020-08-07 (×6): 2 [IU] via SUBCUTANEOUS

## 2020-08-02 MED ORDER — IPRATROPIUM-ALBUTEROL 0.5-2.5 (3) MG/3ML IN SOLN
3.0000 mL | RESPIRATORY_TRACT | Status: DC | PRN
  Administered 2020-08-04 – 2020-08-06 (×3): 3 mL via RESPIRATORY_TRACT
  Filled 2020-08-02 (×5): qty 3

## 2020-08-02 MED ORDER — IPRATROPIUM-ALBUTEROL 0.5-2.5 (3) MG/3ML IN SOLN
3.0000 mL | RESPIRATORY_TRACT | Status: AC
  Administered 2020-08-02: 05:00:00 3 mL via RESPIRATORY_TRACT

## 2020-08-02 MED ORDER — PROPOFOL 1000 MG/100ML IV EMUL
INTRAVENOUS | Status: AC
  Administered 2020-08-02: 10 ug/kg/min via INTRAVENOUS
  Filled 2020-08-02: qty 100

## 2020-08-02 MED ORDER — NOREPINEPHRINE 4 MG/250ML-% IV SOLN
INTRAVENOUS | Status: AC
  Administered 2020-08-02: 06:00:00 4 ug/min via INTRAVENOUS
  Filled 2020-08-02: qty 250

## 2020-08-02 MED ORDER — PANTOPRAZOLE SODIUM 40 MG IV SOLR
40.0000 mg | Freq: Every day | INTRAVENOUS | Status: DC
  Administered 2020-08-02 – 2020-08-03 (×2): 40 mg via INTRAVENOUS
  Filled 2020-08-02 (×2): qty 40

## 2020-08-02 MED ORDER — ENOXAPARIN SODIUM 40 MG/0.4ML ~~LOC~~ SOLN
40.0000 mg | SUBCUTANEOUS | Status: DC
  Administered 2020-08-02 – 2020-08-10 (×9): 40 mg via SUBCUTANEOUS
  Filled 2020-08-02 (×9): qty 0.4

## 2020-08-02 MED ORDER — VANCOMYCIN HCL IN DEXTROSE 1-5 GM/200ML-% IV SOLN: 1000.0000 mg | Freq: Once | INTRAVENOUS | Status: DC

## 2020-08-02 MED ORDER — VITAL HIGH PROTEIN PO LIQD
1000.0000 mL | ORAL | Status: DC
  Administered 2020-08-02: 11:00:00 1000 mL

## 2020-08-02 MED ORDER — ORAL CARE MOUTH RINSE
15.0000 mL | OROMUCOSAL | Status: DC
  Administered 2020-08-02 – 2020-08-03 (×15): 15 mL via OROMUCOSAL

## 2020-08-02 MED ORDER — DOCUSATE SODIUM 100 MG PO CAPS: 100.0000 mg | ORAL_CAPSULE | Freq: Two times a day (BID) | ORAL | Status: DC | PRN

## 2020-08-02 MED ORDER — IOHEXOL 350 MG/ML SOLN
100.0000 mL | Freq: Once | INTRAVENOUS | Status: AC | PRN
  Administered 2020-08-02: 02:00:00 100 mL via INTRAVENOUS

## 2020-08-02 MED ORDER — IPRATROPIUM-ALBUTEROL 0.5-2.5 (3) MG/3ML IN SOLN
3.0000 mL | RESPIRATORY_TRACT | Status: DC
  Administered 2020-08-02 – 2020-08-03 (×9): 3 mL via RESPIRATORY_TRACT
  Filled 2020-08-02 (×9): qty 3

## 2020-08-02 MED ORDER — POLYETHYLENE GLYCOL 3350 17 G PO PACK
17.0000 g | PACK | Freq: Every day | ORAL | Status: DC | PRN
  Filled 2020-08-02: qty 1

## 2020-08-02 MED ORDER — METHYLPREDNISOLONE SODIUM SUCC 125 MG IJ SOLR
80.0000 mg | INTRAMUSCULAR | Status: AC
  Administered 2020-08-02: 05:00:00 80 mg via INTRAVENOUS
  Filled 2020-08-02: qty 2

## 2020-08-02 MED ORDER — MIDAZOLAM 50MG/50ML (1MG/ML) PREMIX INFUSION
0.5000 mg/h | INTRAVENOUS | Status: DC
  Administered 2020-08-02: 04:00:00 0.5 mg/h via INTRAVENOUS
  Filled 2020-08-02 (×2): qty 50

## 2020-08-02 MED ORDER — FENTANYL CITRATE (PF) 100 MCG/2ML IJ SOLN: 25.0000 ug | INTRAMUSCULAR | Status: DC | PRN

## 2020-08-02 MED ORDER — CHLORHEXIDINE GLUCONATE 0.12% ORAL RINSE (MEDLINE KIT)
15.0000 mL | Freq: Two times a day (BID) | OROMUCOSAL | Status: DC
  Administered 2020-08-02 – 2020-08-03 (×3): 15 mL via OROMUCOSAL

## 2020-08-02 MED ORDER — SODIUM CHLORIDE 0.9 % IV SOLN
INTRAVENOUS | Status: DC | PRN
  Administered 2020-08-02: 21:00:00 1000 mL via INTRAVENOUS

## 2020-08-02 MED ORDER — CISATRACURIUM BESYLATE 20 MG/10ML IV SOLN
10.0000 mg | Freq: Once | INTRAVENOUS | Status: DC
  Filled 2020-08-02: qty 10

## 2020-08-02 MED ORDER — METHYLPREDNISOLONE SODIUM SUCC 125 MG IJ SOLR
80.0000 mg | Freq: Three times a day (TID) | INTRAMUSCULAR | Status: DC
  Administered 2020-08-02 – 2020-08-03 (×4): 80 mg via INTRAVENOUS
  Filled 2020-08-02 (×4): qty 2

## 2020-08-02 MED ORDER — NOREPINEPHRINE 4 MG/250ML-% IV SOLN
0.0000 ug/min | INTRAVENOUS | Status: DC
  Administered 2020-08-02: 17:00:00 8 ug/min via INTRAVENOUS
  Administered 2020-08-03: 7 ug/min via INTRAVENOUS
  Filled 2020-08-02 (×2): qty 250

## 2020-08-02 MED ORDER — PIPERACILLIN-TAZOBACTAM 3.375 G IVPB 30 MIN
3.3750 g | Freq: Once | INTRAVENOUS | Status: AC
  Administered 2020-08-02: 03:00:00 3.375 g via INTRAVENOUS
  Filled 2020-08-02: qty 50

## 2020-08-02 MED ORDER — VANCOMYCIN HCL 750 MG/150ML IV SOLN
750.0000 mg | Freq: Two times a day (BID) | INTRAVENOUS | Status: DC
  Administered 2020-08-02: 17:00:00 750 mg via INTRAVENOUS
  Filled 2020-08-02 (×2): qty 150

## 2020-08-02 MED ORDER — LEVOTHYROXINE SODIUM 25 MCG PO TABS: 50.0000 ug | ORAL_TABLET | Freq: Every day | ORAL | Status: DC

## 2020-08-02 MED ORDER — FENTANYL 2500MCG IN NS 250ML (10MCG/ML) PREMIX INFUSION
0.0000 ug/h | INTRAVENOUS | Status: DC
  Administered 2020-08-02: 04:00:00 25 ug/h via INTRAVENOUS
  Administered 2020-08-03: 175 ug/h via INTRAVENOUS
  Filled 2020-08-02 (×2): qty 250

## 2020-08-02 MED ORDER — FENTANYL CITRATE (PF) 100 MCG/2ML IJ SOLN
25.0000 ug | INTRAMUSCULAR | Status: DC | PRN
  Administered 2020-08-02: 100 ug via INTRAVENOUS

## 2020-08-02 MED ORDER — CHLORHEXIDINE GLUCONATE CLOTH 2 % EX PADS
6.0000 | MEDICATED_PAD | Freq: Every day | CUTANEOUS | Status: DC
  Administered 2020-08-02 – 2020-08-09 (×9): 6 via TOPICAL

## 2020-08-02 MED ORDER — LEVOTHYROXINE SODIUM 25 MCG PO TABS
75.0000 ug | ORAL_TABLET | Freq: Every day | ORAL | Status: DC
  Administered 2020-08-02 – 2020-08-03 (×2): 75 ug via NASOGASTRIC
  Filled 2020-08-02 (×2): qty 3

## 2020-08-02 MED ORDER — PROPOFOL 1000 MG/100ML IV EMUL
5.0000 ug/kg/min | INTRAVENOUS | Status: DC
  Administered 2020-08-02: 10 ug/kg/min via INTRAVENOUS
  Administered 2020-08-03: 06:00:00 25 ug/kg/min via INTRAVENOUS
  Filled 2020-08-02: qty 100

## 2020-08-02 MED ORDER — INSULIN ASPART 100 UNIT/ML ~~LOC~~ SOLN
0.0000 [IU] | Freq: Three times a day (TID) | SUBCUTANEOUS | Status: DC
  Administered 2020-08-02: 13:00:00 2 [IU] via SUBCUTANEOUS
  Administered 2020-08-02: 17:00:00 3 [IU] via SUBCUTANEOUS

## 2020-08-02 MED ORDER — ONDANSETRON HCL 4 MG/2ML IJ SOLN: 4.0000 mg | Freq: Four times a day (QID) | INTRAMUSCULAR | Status: DC | PRN

## 2020-08-02 NOTE — Progress Notes (Signed)
Pt transported from ED RESUS[facility] to 2M04 with no complications.

## 2020-08-02 NOTE — Progress Notes (Signed)
Initial Nutrition Assessment  DOCUMENTATION CODES:   Obesity unspecified  INTERVENTION:   Initiate tube feeding via OG tube: Vital High Protein at 60 ml/h (1440 ml per day)  Provides 1440 kcal, 126 gm protein, 1204 ml free water daily  NUTRITION DIAGNOSIS:   Inadequate oral intake related to inability to eat as evidenced by NPO status.  GOAL:   Provide needs based on ASPEN/SCCM guidelines  MONITOR:   Vent status,Labs,TF tolerance  REASON FOR ASSESSMENT:   Ventilator,Consult Enteral/tube feeding initiation and management  ASSESSMENT:   54 yo female admitted with hypercapnic respiratory failure after loss of consciousness at home. S/P CPR x 7-8 minutes by family. PMH includes COPD, depression, HTN, HLD, hypothyroidism.   Received MD Consult for TF initiation and management. OG tube in place.  Patient is currently intubated on ventilator support MV: 11.8 L/min Temp (24hrs), Avg:96.8 F (36 C), Min:95 F (35 C), Max:99.5 F (37.5 C)   Labs reviewed.  CBG: 135-170  Medications reviewed and include novolog, solumedrol, levophed.  Weight history reviewed. No significant weight changes noted.   Diet Order:   Diet Order            Diet NPO time specified  Diet effective now                 EDUCATION NEEDS:   Not appropriate for education at this time  Skin:  Skin Assessment: Reviewed RN Assessment  Last BM:  PTA  Height:   Ht Readings from Last 1 Encounters:  08/02/20 5\' 6"  (1.676 m)    Weight:   Wt Readings from Last 1 Encounters:  08/02/20 111.1 kg    Ideal Body Weight:  59.1 kg  BMI:  Body mass index is 39.53 kg/m.  Estimated Nutritional Needs:   Kcal:  1220-1555  Protein:  120 gm  Fluid:  >/= 1.6 L    14/11/21, RD, LDN, CNSC Please refer to Amion for contact information.

## 2020-08-02 NOTE — Progress Notes (Signed)
Patient arrived to 19m04 from ED via stretcher. Patient intubated, 7.5 ett, 23 at the lip. See chart for vent settings. Patient is unresponsive. Two bilateral AC PIV's C/D/I. Fentanyl @150 , and versed @3 . Patient also getting vancomycin. Patient has 2 grey necklaces and one yellow bracelet that was placed in a denture cup with patient label on it. Patient also has home medicines at the bedside that will be sent home with the family along with the jewelry. Patient has a ring on her right middle finger that wont come off. Foley cath patent with cloudy, yellow urine. Skin tear noted on Left FA and an abrasion on right hand and right buttock. SCD's placed on patient. Continuing to monitor patient.

## 2020-08-02 NOTE — Progress Notes (Signed)
Pharmacy Antibiotic Note  Marie Delacruz is a 54 y.o. female admitted on 08/01/2020 with sepsis.  Pharmacy has been consulted for Vancomycin and Zosyn dosing.  Plan: Zosyn 3.375gm q8h EI Vancomycin 2000mg  IV now then 750 mg IV Q 12 hrs. Will f/u renal function, micro data, and pt's clinical condition Vanc levels prn   Height: 5\' 6"  (167.6 cm) Weight: 107 kg (235 lb 14.3 oz) IBW/kg (Calculated) : 59.3  Temp (24hrs), Avg:95.4 F (35.2 C), Min:95 F (35 C), Max:97.1 F (36.2 C)  Recent Labs  Lab 08/01/20 2350 08/02/20 0151  WBC 21.4*  --   CREATININE 1.09*  --   LATICACIDVEN 3.1* 1.6    Estimated Creatinine Clearance: 72.2 mL/min (A) (by C-G formula based on SCr of 1.09 mg/dL (H)).    No Known Allergies  Antimicrobials this admission: 12/11 Vanco >>  12/11 Zosyn >>   Microbiology results: 12/10 BCx:  12/10 UCx:    Thank you for allowing pharmacy to be a part of this patient's care.  14/10, PharmD, BCPS Please see amion for complete clinical pharmacist phone list 08/02/2020 2:58 AM

## 2020-08-02 NOTE — Procedures (Signed)
Intubation Procedure Note  Marie Delacruz  [id number]  1966/05/22  Date:08/02/20  Time:5:05 AM   Provider Performing:Jemina Scahill, Estelle Grumbles    Procedure: Intubation (31500)  Indication(s) Respiratory Failure  Consent Unable to obtain consent due to emergent nature of procedure.   Anesthesia Etomidate   Time Out Verified patient identification, verified procedure, site/side was marked, verified correct patient position, special equipment/implants available, medications/allergies/relevant history reviewed, required imaging and test results available.   Sterile Technique Usual hand hygeine, masks, and gloves were used   Procedure Description Patient positioned in bed supine.  Sedation given as noted above.  Patient was intubated with endotracheal tube using Glidescope.  View was Grade 1 full glottis .  Number of attempts was 1.  Colorimetric CO2 detector was consistent with tracheal placement.   Complications/Tolerance None; patient tolerated the procedure well. Chest X-ray is ordered to verify placement.   EBL Minimal   Specimen(s) None

## 2020-08-02 NOTE — Progress Notes (Signed)
Critical ABG results given to Dr Francine Graven.

## 2020-08-02 NOTE — Progress Notes (Signed)
Pt transported from ED RESUSC to CT and back with no complications.  

## 2020-08-02 NOTE — Progress Notes (Signed)
eLink Physician-Brief Progress Note Patient Name: Marie Delacruz DOB: 05/07/1966 MRN: [id number]   Date of Service  08/02/2020  HPI/Events of Note  Patient needs a PRN Fentanyl order in order to optimize patients sedation on the ventilator.  eICU Interventions  PRN Fentanyl ordered.        Thomasene Lot [name] 08/02/2020, 11:04 PM

## 2020-08-02 NOTE — Progress Notes (Signed)
NAME:  Marie Delacruz, MRN:  [id number], DOB:  01-Oct-1965, LOS: 0 ADMISSION DATE:  08/01/2020, CONSULTATION DATE:  08/02/20 REFERRING MD: Tegeler, ED , CHIEF COMPLAINT:  Loss of consciousnesss  Brief History   54 year old woman with hx of copd, depression, HTN, HLD, hypothyroidism, here after sudden loss of consciousness.    History of present illness   Patient slumped over while doing dishes, guided to floor by witnessing family.  CPR done by family for 7-8 min  Patient found unresponsive with pinpoint pupils by EMS.   Oxygen saturation initially in 50s.   Narcan was given 1mg  Im then x1 IV.   Placed on CPAP, did not tolerate, switched to NRB.    Intubated on arrival to ED.   Husband endorses recent cough and sob.   Past Medical History   Past Medical History:  Diagnosis Date  . Anxiety   . COPD (chronic obstructive pulmonary disease) (HCC)   . Depression   . HLD (hyperlipidemia)   . HTN (hypertension)   . Hypothyroid   . 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 Obesity   . PONV (postoperative nausea and vomiting)   . Prolapse of posterior vaginal wall      Past Surgical History:  Procedure Laterality Date  . BACK SURGERY    . CYSTOSCOPY N/A 05/08/2020   Procedure: CYSTOSCOPY;  Surgeon: 05/10/2020, MD;  Location: [facility];  Service: Gynecology;  Laterality: N/A;  . FACIAL COSMETIC SURGERY    . RECTOCELE REPAIR N/A 05/08/2020   Procedure: POSTERIOR REPAIR (RECTOCELE), MCCALLS,;  Surgeon: 05/10/2020, MD;  Location: [facility];  Service: Gynecology;  Laterality: N/A;  . SPINAL FUSION    . TUBAL LIGATION    . VAGINAL HYSTERECTOMY N/A 05/08/2020   Procedure: HYSTERECTOMY VAGINAL;  Surgeon: 05/10/2020, MD;  Location: [facility];  Service: Gynecology;  Laterality: N/A;  need bed     Significant Hospital Events    08/01/20 Admitted  Consults:  None  Procedures:  Intubation 12/11  Significant Diagnostic Tests:  CXR 12/10 - No acute cardiopulmonary disease  CT chest 12/11 1. No acute pulmonary embolism. 2. Hazy bilateral ground-glass airspace opacities, concerning for an atypical infectious process such as viral pneumonia or developing pulmonary edema. 3. Trace right-sided pleural effusion.  CT Head W/O C 12/11 - No intracranial abnormality, Acute sphenoid sinusitis  Echo:  EF 50-55%, no valvular disease.  Micro Data:  Influenza neg Covid neg BC x 2 12/10 pending UC Pending Respiratory Panel PCR Pending MRSA PCR Negative Antimicrobials:  Zosyn, Vanc, Aziothro Started 08/01/20  Interim history/subjective:  Patient transferred to ICU yesterday evening after admission. Remains in ICU, sedated and intubated overnight. Worsening acidosis.  Objective   Blood pressure 113/79, pulse 90, temperature 98.96 F (37.2 C), resp. rate (!) 24, height 5\' 6"  (1.676 m), weight 111.1 kg, SpO2 98 %.    Vent Mode: PRVC FiO2 (%):  [60 %-100 %] 60 % Set Rate:  [20 bmp-32 bmp] 26 bmp Vt Set:  [470 mL] 470 mL PEEP:  [5 cmH20-8 cmH20] 8 cmH20 Plateau Pressure:  [23 cmH20-32 cmH20] 31 cmH20   Intake/Output Summary (Last 24 hours) at 08/02/2020 1054 Last data filed at 08/02/2020 0900 Gross per 24 hour  Intake 1083.87 ml  Output 835 ml  Net 248.87 ml   14/06/2020  08/02/20 0004 08/02/20 0500  Weight: 107 kg 111.1 kg    Examination: General: Intubated, synchronous with vent HENT: Moist mucous membranes Lungs: Bilateral mechanical vent breath sounds.   Cardiovascular: tachycardic, no murmurs, gallops, rubs appreciated Abdomen: Minimally distended. Does not appear to be tender. Extremities: no edema no erythema Neuro: sedated   Assessment & Plan:  Hypoxemic, hypercarbic respiratory failure:  Likely secondary COPD exacerbation, patient with history of COPD. No PFT's in [facility]  system or care everywhere. Unclear if she actually suffered cardiac/respiratory arrest. Apparently was not pulseless at the time of EMS arrival. Patient remains intubated on mechanical ventilation. Plan: Changed to pressure assisted vent setting this am. FiO2 lowered as patient with worsening acidosis this am.  Continue sedation, duonebs, antibiotics.   Marland Kitchen   Respiratory viral panel, legionella, trach aspirate culture pending  Will perform afternoon ABG, adjust vent settings as needed Will start tube feeds per nutrition  Loss of consciousness:  Suspect secondary to respiratory failure as described above. Per chart review, husband noted patient had coughing spell prior to episode, possible vasovagal event. Negative echo and head CT, low suspicion for cardiac or neurological etiologies. Per history, does not seem as though patient fell, hit her head, or had any trauma. Plan: -echo with 50-55% EF. No valvular disease. -CT head w/o contrast negative for ischemic disease.  Hx of Hypothyroidism Patient on home levothyroxine. TSH of 9.  -continue levothyroxine 75 mcg daily  Hx of HLD Continue home atorvastatin 40 mg daily  Best practice (evaluated daily)   Diet: TF  Pain/Anxiety/Delirium protocol (if indicated): Fent/Precedex/Versed VAP protocol (if indicated):  DVT prophylaxis: lovenox/SCD GI prophylaxis: protonix Glucose control: SSI Mobility: Bedrest last date of multidisciplinary goals of care discussion: N/A Family and staff present: Nurse at bedside Summary of discussion: N/A Follow up goals of care discussion due 08/02/20 Code Status: Full Code  Disposition: ICU   Labs   CBC: Recent Labs  Lab 08/01/20 2350 08/02/20 0057 08/02/20 0239 08/02/20 0253 08/02/20 0904 08/02/20 1001  WBC 21.4*  --   --   --   --  24.4*  NEUTROABS 9.8*  --   --   --   --   --   HGB 13.9 14.6 14.6 14.3 13.3 13.1  HCT 44.3 43.0 43.0 42.0 39.0 42.5  MCV 100.0  --   --   --   --  100.7*  PLT 380   --   --   --   --  319    Basic Metabolic Panel: Recent Labs  Lab 08/01/20 2350 08/02/20 0057 08/02/20 0239 08/02/20 0253 08/02/20 0904  NA 135 136 139 138 135  K 3.4* 3.7 3.7 3.6 3.8  CL 98  --   --   --   --   CO2 27  --   --   --   --   GLUCOSE 292*  --   --   --   --   BUN 15  --   --   --   --   CREATININE 1.09*  --   --   --   --   CALCIUM 9.8  --   --   --   --    GFR: Estimated Creatinine Clearance: 73.6 mL/min (A) (by C-G formula based on SCr of 1.09 mg/dL (H)). Recent Labs  Lab 08/01/20 2350 08/02/20 0151 08/02/20 1001  WBC 21.4*  --  24.4*  LATICACIDVEN 3.1* 1.6  --     Liver  Function Tests: Recent Labs  Lab 08/01/20 2350  AST 27  ALT 26  ALKPHOS 119  BILITOT 0.7  PROT 7.5  ALBUMIN 4.3   Recent Labs  Lab 08/01/20 2350  LIPASE 85*   Recent Labs  Lab 08/02/20 0159  AMMONIA 57*    ABG    Component Value Date/Time   PHART 7.128 (LL) 08/02/2020 0904   PCO2ART 94.8 (HH) 08/02/2020 0904   PO2ART 215 (H) 08/02/2020 0904   HCO3 31.3 (H) 08/02/2020 0904   TCO2 34 (H) 08/02/2020 0904   ACIDBASEDEF 1.0 08/02/2020 0904   O2SAT 99.0 08/02/2020 0904     Coagulation Profile: Recent Labs  Lab 08/01/20 2350  INR 1.0    Cardiac Enzymes: No results for input(s): CKTOTAL, CKMB, CKMBINDEX, TROPONINI in the last 168 hours.  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   Surgical History    Past Surgical History:  Procedure Laterality Date  . BACK SURGERY    . CYSTOSCOPY N/A 05/08/2020   Procedure: CYSTOSCOPY;  Surgeon: Ranae Pila, MD;  Location: [facility];  Service: Gynecology;  Laterality: N/A;  . FACIAL COSMETIC SURGERY    . RECTOCELE REPAIR N/A 05/08/2020   Procedure: POSTERIOR REPAIR (RECTOCELE), MCCALLS,;  Surgeon: Ranae Pila, MD;  Location: [facility];  Service: Gynecology;  Laterality: N/A;  . SPINAL FUSION    . TUBAL LIGATION    . VAGINAL HYSTERECTOMY N/A 05/08/2020   Procedure: HYSTERECTOMY VAGINAL;  Surgeon: Ranae Pila, MD;  Location: [facility];  Service: Gynecology;  Laterality: N/A;  need bed     Social History   reports that she quit smoking about 7 months ago. She has never used smokeless tobacco. She reports that she does not drink alcohol and does not use drugs.   Family History   Her family history is not on file.   Allergies No Known Allergies   Home Medications  Prior to Admission medications   Medication Sig Start Date End Date Taking? Authorizing Provider  ANORO ELLIPTA 62.5-25 MCG/INH AEPB Inhale 1 puff into the lungs daily. 04/03/20  Yes [provider]  atorvastatin (LIPITOR) 40 MG tablet Take 40 mg by mouth at bedtime. 03/24/20  Yes [provider]  Calcium Carb-Cholecalciferol 500-125 MG-UNIT TABS Take 1 tablet by mouth daily.   Yes [provider]  Cholecalciferol (VITAMIN D) 50 MCG (2000 UT) CAPS Take 2,000 Units by mouth daily.   Yes [provider]  desvenlafaxine (PRISTIQ) 100 MG 24 hr tablet Take 100 mg by mouth daily.   Yes [provider]  docusate sodium (COLACE) 100 MG capsule Take 1 capsule (100 mg total) by mouth 2 (two) times daily. 05/09/20  Yes Ranae PilaLeger, Elise Jennifer, MD  DULoxetine (CYMBALTA) 60 MG capsule Take 60 mg by mouth  daily. 04/03/20  Yes [provider]  fluticasone (FLONASE) 50 MCG/ACT nasal spray Place 1 spray into both nostrils daily as needed for allergies or rhinitis.   Yes [provider]  ibuprofen (ADVIL) 600 MG tablet Take 1 tablet (600 mg total) by mouth every 6 (six) hours as needed. 05/09/20  Yes Ranae PilaLeger, Elise Jennifer, MD  levothyroxine (SYNTHROID) 75 MCG tablet Take 75 mcg by mouth daily before breakfast. 03/24/20  Yes [provider]  lisinopril (ZESTRIL) 20 MG tablet Take 20 mg by mouth daily. 01/07/20  Yes [provider]  Multiple Vitamin (MULTIVITAMIN WITH MINERALS) TABS tablet Take 1 tablet by mouth daily.   Yes [provider]  nicotine (NICODERM CQ - DOSED IN MG/24 HOURS) 21 mg/24hr patch Place 21 mg onto the skin daily.   Yes [provider]  traMADol (ULTRAM) 50 MG tablet Take 1 tablet (50 mg total) by mouth every 4 (four) hours as needed for moderate pain. 05/09/20  Yes Ranae PilaLeger, Elise Jennifer, MD  triamcinolone cream (KENALOG) 0.1 % Apply 1 application topically daily as needed (psoriasis).  01/03/20  Yes [provider]         Thalia BloodgoodVasili Ahmyah Gidley  DO Internal Medicine Resident PGY-1 [facility]

## 2020-08-02 NOTE — ED Notes (Signed)
Pt husband Lillie Portner) called for an update. Please call husband back at [phone]

## 2020-08-02 NOTE — ED Notes (Signed)
Husband wamts an update [phone]

## 2020-08-02 NOTE — Progress Notes (Signed)
  Echocardiogram 2D Echocardiogram has been performed with Definity.  Gerda Diss 08/02/2020, 9:02 AM

## 2020-08-02 NOTE — ED Notes (Signed)
Pt to CT via stretcher on CCM with RN and RT

## 2020-08-02 NOTE — Progress Notes (Signed)
eLink Physician-Brief Progress Note Patient Name: MEEYA GOLDIN DOB: March 15, 1966 MRN: [id number]   Date of Service  08/02/2020  HPI/Events of Note  Patient needs Q 4 hours CBG with SSI coverage order.  eICU Interventions  CBG with SSI Q 4 hourly ordered.        Thomasene Lot Keny Donald 08/02/2020, 8:29 PM

## 2020-08-02 NOTE — ED Notes (Signed)
Etomidate 20mg  IVP given Roc 50mg  IVP given

## 2020-08-02 NOTE — ED Notes (Signed)
RN aware of VS 

## 2020-08-02 NOTE — Progress Notes (Signed)
eLink Physician-Brief Progress Note Patient Name: Marie Delacruz DOB: 12-14-1965 MRN: [id number]   Date of Service  08/02/2020  HPI/Events of Note  31F with history of COPD, depression/anxiety, and outpatient narcotic use who suffered a possible witnessed out of hospital cardiac arrest at home. CPR performed by family for 7-8 minutes. Vomiting with aspiration is suspected. When EMS arrived the patient had a pulse, raising the question of whether she had ROSC or did not truly arrest. Narcan given by EMS upon arrival leading to some questionable improvement in breathing but still in respiratory distress and severely hypoxemic. Patient arrived to ED on NRB with GCS of 3. She was intubated immediately.  Husband states she had a cough and dyspnea for a few days but denied any chest pain. He also reports she had a coughing fit prior to becoming unresponsive.  CTA Chest shows no PE, but there are hazy bilateral GGOs c/f viral pneumonia vs developing pulmonary edema. There is also a trace right sided pleural effusion. CT Head wnl.  COVID/Flu negative.  On my exam, the patient is extremely dyssynchronous with the ventilator despite deep sedation with versed at 7 mg/hr and fentanyl at 200 mcg/hr. Rocuronium 50mg  IV push was ordered in order to stabilize her on the ventilator acutely.  HR 116 (SR), BP 105/69, RR 32, SpO2 100%.   eICU Interventions  Agree that the clinical history is most compatible with COPD exacerbation.  Treat with DuoNebs, antibiotics, steroids, mechanical ventilation +/- PRN paralytics for vent synchrony.  Remainder of plan per Dr. H&P.     Intervention Category Evaluation Type: New Patient Evaluation  Dianah Field 08/02/2020, 4:48 AM

## 2020-08-02 NOTE — H&P (Addendum)
NAME:  Marie Delacruz, MRN:  [id number], DOB:  Apr 29, 1966, LOS: 0 ADMISSION DATE:  08/01/2020, CONSULTATION DATE:  08/02/20 REFERRING MD: Tegeler, ED , CHIEF COMPLAINT:  Loss of consciousnesss  Brief History   54 year old woman with hx of copd, depression, HTN, HLD, hypothyroidism, here after sudden loss of consciousness.    History of present illness   Patient slumped over while doing dishes, guided to floor by witnessing family.  CPR done by family for 7-8 min  Patient found unresponsive with pinpoint pupils by EMS.   Oxygen saturation initially in 50s.   Narcan was given 1mg  Im then x1 IV.   Placed on CPAP, did not tolerate, switched to NRB.    Intubated on arrival to ED.   Husband endorses recent cough and sob.   Past Medical History   Past Medical History:  Diagnosis Date  . Anxiety   . COPD (chronic obstructive pulmonary disease) (HCC)   . Depression   . HLD (hyperlipidemia)   . HTN (hypertension)   . Hypothyroid   . 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 Obesity   . PONV (postoperative nausea and vomiting)   . Prolapse of posterior vaginal wall      Past Surgical History:  Procedure Laterality Date  . BACK SURGERY    . CYSTOSCOPY N/A 05/08/2020   Procedure: CYSTOSCOPY;  Surgeon: 05/10/2020, MD;  Location: [facility];  Service: Gynecology;  Laterality: N/A;  . FACIAL COSMETIC SURGERY    . RECTOCELE REPAIR N/A 05/08/2020   Procedure: POSTERIOR REPAIR (RECTOCELE), MCCALLS,;  Surgeon: 05/10/2020, MD;  Location: [facility];  Service: Gynecology;  Laterality: N/A;  . SPINAL FUSION    . TUBAL LIGATION    . VAGINAL HYSTERECTOMY N/A 05/08/2020   Procedure: HYSTERECTOMY VAGINAL;  Surgeon: 05/10/2020, MD;  Location: [facility];  Service: Gynecology;  Laterality: N/A;  need bed     Significant Hospital Events      Consults:    Procedures:  Intubation 12/11  Significant Diagnostic Tests:  CT chest;  1. No acute pulmonary embolism. 2. Hazy bilateral ground-glass airspace opacities, concerning for an atypical infectious process such as viral pneumonia or developing pulmonary edema. 3. Trace right-sided pleural effusion.  Cxr;  Endotracheal tube 4.7 cm above the carina. No acute cardiopulmonary disease.  Echo:  pending Micro Data:   Influenza neg Covid neg Antimicrobials:    Interim history/subjective:    Objective   Blood pressure (!) 96/52, pulse 86, temperature (!) 95.3 F (35.2 C), resp. rate 16, height 5\' 6"  (1.676 m), weight 107 kg, SpO2 (!) 88 %.    FiO2 (%):  [60 %-100 %] 60 % Set Rate:  [20 bmp-26 bmp] 26 bmp Vt Set:  [470 mL] 470 mL PEEP:  [5 cmH20] 5 cmH20 Plateau Pressure:  [30 cmH20] 30 cmH20  No intake or output data in the 24 hours ending 08/02/20 0255 Filed Weights   08/02/20 0004  Weight: 107 kg    Examination: General: Intubated, intermittent dyssynchrony with vent HENT: NCAT Lungs: poor air movement.  Cardiovascular: tachycardic, irregular Abdomen: slightly distended, no apparent tenderness, nbs Extremities: no edema no erythema Neuro: sedated   Resolved Hospital Problem list     Assessment & Plan:  Hypoxemic, hypercarbic respiratory failure:  Likely 2/2 COPD exacerbation.  Poor air movement.  Sedation.  Duonebs, systemic steroids.   Treatment for possible severe CAP, narrow when able.   Check U strep, Urine legionella.  Repeat ABG after adjustment made to ventilator.   Loss of consciousness: unclear cause.  Possibly hypercarbia vs hypoxemia.  Unclear if she actually suffered cardiac arrest.  Apparently was not pulseless at the time of EMS arrival.     Best practice (evaluated daily)   Diet: npo  Pain/Anxiety/Delirium protocol (if indicated): versed/fent.  Unable to tolerate propofol VAP protocol (if indicated):  DVT prophylaxis:  lovenox GI prophylaxis: protonix Glucose control:  Mobility:  last date of multidisciplinary goals of care discussion Family and staff present  Summary of discussion  Follow up goals of care discussion due Code Status: Full Code  Disposition: ICU   Labs   CBC: Recent Labs  Lab 08/01/20 2350 08/02/20 0057 08/02/20 0239  WBC 21.4*  --   --   NEUTROABS 9.8*  --   --   HGB 13.9 14.6 14.6  HCT 44.3 43.0 43.0  MCV 100.0  --   --   PLT 380  --   --     Basic Metabolic Panel: Recent Labs  Lab 08/01/20 2350 08/02/20 0057 08/02/20 0239  NA 135 136 139  K 3.4* 3.7 3.7  CL 98  --   --   CO2 27  --   --   GLUCOSE 292*  --   --   BUN 15  --   --   CREATININE 1.09*  --   --   CALCIUM 9.8  --   --    GFR: Estimated Creatinine Clearance: 72.2 mL/min (A) (by C-G formula based on SCr of 1.09 mg/dL (H)). Recent Labs  Lab 08/01/20 2350 08/02/20 0151  WBC 21.4*  --   LATICACIDVEN 3.1* 1.6    Liver Function Tests: Recent Labs  Lab 08/01/20 2350  AST 27  ALT 26  ALKPHOS 119  BILITOT 0.7  PROT 7.5  ALBUMIN 4.3   Recent Labs  Lab 08/01/20 2350  LIPASE 85*   Recent Labs  Lab 08/02/20 0159  AMMONIA 57*    ABG    Component Value Date/Time   PHART 7.166 (LL) 08/02/2020 0239   PCO2ART 83.9 (HH) 08/02/2020 0239   PO2ART 55 (L) 08/02/2020 0239   HCO3 30.6 (H) 08/02/2020 0239   TCO2 33 (H) 08/02/2020 0239   ACIDBASEDEF 1.0 08/02/2020 0239   O2SAT 79.0 08/02/2020 0239     Coagulation Profile: Recent Labs  Lab 08/01/20 2350  INR 1.0    Cardiac Enzymes: No results for input(s): CKTOTAL, CKMB, CKMBINDEX, TROPONINI in the last 168 hours.  HbA1C: No results found for: HGBA1C  CBG: No results for input(s): GLUCAP in the last 168 hours.  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   Surgical History    Past Surgical History:  Procedure Laterality Date  . BACK SURGERY    . CYSTOSCOPY N/A 05/08/2020   Procedure: CYSTOSCOPY;  Surgeon: Ranae Pila, MD;  Location: [facility];  Service: Gynecology;  Laterality: N/A;  . FACIAL COSMETIC SURGERY    . RECTOCELE REPAIR N/A 05/08/2020   Procedure: POSTERIOR REPAIR (RECTOCELE), MCCALLS,;  Surgeon: Ranae Pila, MD;  Location: [facility];  Service: Gynecology;  Laterality: N/A;  . SPINAL FUSION    . TUBAL LIGATION    . VAGINAL HYSTERECTOMY N/A 05/08/2020   Procedure: HYSTERECTOMY VAGINAL;  Surgeon: Ranae Pila, MD;  Location: [facility];  Service: Gynecology;  Laterality: N/A;  need bed     Social History   reports that she quit smoking about 7 months ago. She has never used smokeless tobacco. She reports that she does not drink alcohol and does not use drugs.   Family History   Her family history is not on file.   Allergies No Known Allergies   Home Medications  Prior to Admission medications   Medication Sig Start Date End Date Taking? Authorizing Provider  ANORO ELLIPTA 62.5-25 MCG/INH AEPB Inhale 1 puff into the lungs daily. 04/03/20  Yes [provider]  atorvastatin (LIPITOR) 40 MG tablet Take 40 mg by mouth at bedtime. 03/24/20  Yes [provider]  Calcium Carb-Cholecalciferol 500-125 MG-UNIT TABS Take 1 tablet by mouth daily.   Yes [provider]  Cholecalciferol (VITAMIN D) 50 MCG (2000 UT) CAPS Take 2,000 Units by mouth daily.   Yes [provider]  desvenlafaxine (PRISTIQ) 100 MG 24 hr tablet Take 100 mg by mouth daily.   Yes [provider]  docusate sodium (COLACE) 100 MG capsule Take 1 capsule (100 mg total) by mouth 2 (two) times  daily. 05/09/20  Yes Ranae Pila, MD  DULoxetine (CYMBALTA) 60 MG capsule Take 60 mg by mouth daily. 04/03/20  Yes [provider]  fluticasone (FLONASE) 50 MCG/ACT nasal spray Place 1 spray into both nostrils daily as needed for allergies or rhinitis.   Yes [provider]  ibuprofen (ADVIL) 600 MG tablet Take 1 tablet (600 mg total) by mouth every 6 (six) hours as needed. 05/09/20  Yes Ranae Pila, MD  levothyroxine (SYNTHROID) 75 MCG tablet Take 75 mcg by mouth daily before breakfast. 03/24/20  Yes [provider]  lisinopril (ZESTRIL) 20 MG tablet Take 20 mg by mouth daily. 01/07/20  Yes [provider]  Multiple Vitamin (MULTIVITAMIN WITH MINERALS) TABS tablet Take 1 tablet by mouth daily.   Yes [provider]  nicotine (NICODERM CQ - DOSED IN MG/24 HOURS) 21 mg/24hr patch Place 21 mg onto the skin daily.   Yes [provider]  traMADol (ULTRAM) 50 MG tablet Take 1 tablet (50 mg total) by mouth every 4 (four) hours as needed for moderate pain. 05/09/20  Yes Ranae Pila, MD  triamcinolone cream (KENALOG) 0.1 % Apply 1 application topically daily as needed (psoriasis).  01/03/20  Yes [provider]     Critical care time: 45 minutes.     Charlotte Sanes MD, CCM

## 2020-08-02 NOTE — Progress Notes (Signed)
Critical ABG results given to Dr Dewald.  

## 2020-08-02 NOTE — Progress Notes (Signed)
Pt transported from ED RESUSC to CT and back with no complications.

## 2020-08-02 NOTE — Progress Notes (Signed)
eLink Physician-Brief Progress Note Patient Name: Marie Delacruz DOB: [birth year] MRN: [id number]   Date of Service  08/02/2020  HPI/Events of Note  Patient following commands, remains on full ventilator support, and is intermittently agitated.  eICU Interventions  Will adequately sedate with Propofol overnight and plan to wean ventilator settings as tolerated with a goal of extubation in the a.m. if she meets criteria.        Thomasene Lot Merwyn Hodapp 08/02/2020, 11:36 PM

## 2020-08-03 LAB — POCT I-STAT 7, (LYTES, BLD GAS, ICA,H+H)
Acid-Base Excess: 0 mmol/L (ref 0.0–2.0)
Bicarbonate: 28 mmol/L (ref 20.0–28.0)
Calcium, Ion: 1.29 mmol/L (ref 1.15–1.40)
HCT: 38 % (ref 36.0–46.0)
Hemoglobin: 12.9 g/dL (ref 12.0–15.0)
O2 Saturation: 87 %
Patient temperature: 37.5
Potassium: 3.5 mmol/L (ref 3.5–5.1)
Sodium: 136 mmol/L (ref 135–145)
TCO2: 30 mmol/L (ref 22–32)
pCO2 arterial: 59 mmHg — ABNORMAL HIGH (ref 32.0–48.0)
pH, Arterial: 7.287 — ABNORMAL LOW (ref 7.350–7.450)
pO2, Arterial: 63 mmHg — ABNORMAL LOW (ref 83.0–108.0)

## 2020-08-03 LAB — GLUCOSE, CAPILLARY
Glucose-Capillary: 130 mg/dL — ABNORMAL HIGH (ref 70–99)
Glucose-Capillary: 138 mg/dL — ABNORMAL HIGH (ref 70–99)
Glucose-Capillary: 143 mg/dL — ABNORMAL HIGH (ref 70–99)
Glucose-Capillary: 151 mg/dL — ABNORMAL HIGH (ref 70–99)
Glucose-Capillary: 180 mg/dL — ABNORMAL HIGH (ref 70–99)
Glucose-Capillary: 242 mg/dL — ABNORMAL HIGH (ref 70–99)

## 2020-08-03 LAB — COMPREHENSIVE METABOLIC PANEL
ALT: 28 U/L (ref 0–44)
AST: 41 U/L (ref 15–41)
Albumin: 3.4 g/dL — ABNORMAL LOW (ref 3.5–5.0)
Alkaline Phosphatase: 74 U/L (ref 38–126)
Anion gap: 12 (ref 5–15)
BUN: 23 mg/dL — ABNORMAL HIGH (ref 6–20)
CO2: 24 mmol/L (ref 22–32)
Calcium: 9.1 mg/dL (ref 8.9–10.3)
Chloride: 100 mmol/L (ref 98–111)
Creatinine, Ser: 1.3 mg/dL — ABNORMAL HIGH (ref 0.44–1.00)
GFR, Estimated: 49 mL/min — ABNORMAL LOW (ref >60)
Glucose, Bld: 219 mg/dL — ABNORMAL HIGH (ref 70–99)
Potassium: 3.6 mmol/L (ref 3.5–5.1)
Sodium: 136 mmol/L (ref 135–145)
Total Bilirubin: 0.5 mg/dL (ref 0.3–1.2)
Total Protein: 6.4 g/dL — ABNORMAL LOW (ref 6.5–8.1)

## 2020-08-03 LAB — CBC
HCT: 39.8 % (ref 36.0–46.0)
Hemoglobin: 12.8 g/dL (ref 12.0–15.0)
MCH: 31.1 pg (ref 26.0–34.0)
MCHC: 32.2 g/dL (ref 30.0–36.0)
MCV: 96.6 fL (ref 80.0–100.0)
Platelets: 288 10*3/uL (ref 150–400)
RBC: 4.12 MIL/uL (ref 3.87–5.11)
RDW: 14.1 % (ref 11.5–15.5)
WBC: 32.9 10*3/uL — ABNORMAL HIGH (ref 4.0–10.5)
nRBC: 0 % (ref 0.0–0.2)

## 2020-08-03 LAB — URINE CULTURE: Culture: NO GROWTH

## 2020-08-03 LAB — PROCALCITONIN: Procalcitonin: 7.22 ng/mL

## 2020-08-03 LAB — TRIGLYCERIDES: Triglycerides: 77 mg/dL (ref <150)

## 2020-08-03 MED ORDER — ARFORMOTEROL TARTRATE 15 MCG/2ML IN NEBU
15.0000 ug | INHALATION_SOLUTION | Freq: Two times a day (BID) | RESPIRATORY_TRACT | Status: DC
  Administered 2020-08-03 – 2020-08-10 (×14): 15 ug via RESPIRATORY_TRACT
  Filled 2020-08-03 (×15): qty 2

## 2020-08-03 MED ORDER — REVEFENACIN 175 MCG/3ML IN SOLN
175.0000 ug | Freq: Every day | RESPIRATORY_TRACT | Status: DC
  Administered 2020-08-03 – 2020-08-08 (×5): 175 ug via RESPIRATORY_TRACT
  Administered 2020-08-09: 350 ug via RESPIRATORY_TRACT
  Administered 2020-08-10: 175 ug via RESPIRATORY_TRACT
  Filled 2020-08-03 (×9): qty 3

## 2020-08-03 MED ORDER — ATORVASTATIN CALCIUM 40 MG PO TABS
40.0000 mg | ORAL_TABLET | Freq: Every evening | ORAL | Status: DC
  Administered 2020-08-05 – 2020-08-09 (×5): 40 mg via ORAL
  Filled 2020-08-03 (×5): qty 1

## 2020-08-03 MED ORDER — METHYLPREDNISOLONE SODIUM SUCC 125 MG IJ SOLR
60.0000 mg | Freq: Every day | INTRAMUSCULAR | Status: DC
  Administered 2020-08-03 – 2020-08-05 (×3): 60 mg via INTRAVENOUS
  Filled 2020-08-03 (×3): qty 2

## 2020-08-03 MED ORDER — DOCUSATE SODIUM 50 MG/5ML PO LIQD: 100.0000 mg | Freq: Two times a day (BID) | ORAL | Status: DC | PRN

## 2020-08-03 MED ORDER — BISACODYL 10 MG RE SUPP
10.0000 mg | Freq: Once | RECTAL | Status: AC
  Administered 2020-08-03: 20:00:00 10 mg via RECTAL
  Filled 2020-08-03: qty 1

## 2020-08-03 MED ORDER — DOCUSATE SODIUM 100 MG PO CAPS
100.0000 mg | ORAL_CAPSULE | Freq: Two times a day (BID) | ORAL | Status: DC | PRN
  Filled 2020-08-03: qty 1

## 2020-08-03 MED ORDER — ORAL CARE MOUTH RINSE
15.0000 mL | Freq: Two times a day (BID) | OROMUCOSAL | Status: DC
  Administered 2020-08-04 – 2020-08-09 (×9): 15 mL via OROMUCOSAL

## 2020-08-03 MED ORDER — WHITE PETROLATUM EX OINT
TOPICAL_OINTMENT | CUTANEOUS | Status: DC | PRN
  Filled 2020-08-03: qty 28.35

## 2020-08-03 MED ORDER — LACTATED RINGERS IV BOLUS
500.0000 mL | Freq: Once | INTRAVENOUS | Status: AC
  Administered 2020-08-03: 20:00:00 500 mL via INTRAVENOUS

## 2020-08-03 MED ORDER — CHLORHEXIDINE GLUCONATE 0.12 % MT SOLN
15.0000 mL | Freq: Two times a day (BID) | OROMUCOSAL | Status: DC
  Administered 2020-08-03 – 2020-08-10 (×13): 15 mL via OROMUCOSAL
  Filled 2020-08-03 (×10): qty 15

## 2020-08-03 MED ORDER — LEVOTHYROXINE SODIUM 75 MCG PO TABS
75.0000 ug | ORAL_TABLET | Freq: Every day | ORAL | Status: DC
  Administered 2020-08-06 – 2020-08-10 (×5): 75 ug via ORAL
  Filled 2020-08-03 (×4): qty 1
  Filled 2020-08-03 (×2): qty 3

## 2020-08-03 NOTE — Progress Notes (Signed)
All of pt.'s jewelry, including wedding ring and wedding band, and home medications returned home with patient's husband, Marie Delacruz.

## 2020-08-03 NOTE — Progress Notes (Signed)
NAME:  Marie Delacruz, MRN:  [id number], DOB:  1966/01/11, LOS: 1 ADMISSION DATE:  08/01/2020, CONSULTATION DATE:  08/02/20 REFERRING MD: Tegeler, ED , CHIEF COMPLAINT:  Loss of consciousnesss  Brief History   54 year old woman with hx of copd, depression, HTN, HLD, hypothyroidism, here after sudden loss of consciousness. Admitted for hypercapnic respiratory failure     Past Medical History   Past Medical History:  Diagnosis Date  . Anxiety   . COPD (chronic obstructive pulmonary disease) (HCC)   . Depression   . HLD (hyperlipidemia)   . HTN (hypertension)   . Hypothyroid   . 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 Obesity   . PONV (postoperative nausea and vomiting)   . Prolapse of posterior vaginal wall      Past Surgical History:  Procedure Laterality Date  . BACK SURGERY    . CYSTOSCOPY N/A 05/08/2020   Procedure: CYSTOSCOPY;  Surgeon: Ranae Pila, MD;  Location: [facility];  Service: Gynecology;  Laterality: N/A;  . FACIAL COSMETIC SURGERY    . RECTOCELE REPAIR N/A 05/08/2020   Procedure: POSTERIOR REPAIR (RECTOCELE), MCCALLS,;  Surgeon: Ranae Pila, MD;  Location: [facility];  Service: Gynecology;  Laterality: N/A;  . SPINAL FUSION    . TUBAL LIGATION    . VAGINAL HYSTERECTOMY N/A 05/08/2020   Procedure: HYSTERECTOMY VAGINAL;  Surgeon: Ranae Pila, MD;  Location: [facility];  Service: Gynecology;  Laterality: N/A;  need bed     Significant Hospital Events   08/01/20 Admitted 08/03/20 Extuabted to bipap  Consults:  None  Procedures:  Intubation 12/11 - 12/12  Significant Diagnostic Tests:  CXR 12/10 - No acute cardiopulmonary disease  CT chest 12/11 1. No acute pulmonary embolism. 2. Hazy bilateral ground-glass airspace opacities, concerning for an atypical infectious process such as viral pneumonia or  developing pulmonary edema. 3. Trace right-sided pleural effusion.  CT Head W/O C 12/11 - No intracranial abnormality, Acute sphenoid sinusitis  Echo:  EF 50-55%, no valvular disease.  Micro Data:  Influenza neg Covid neg BC x 2 12/10 - no growth UC - no growth Respiratory Panel PCR - negative MRSA PCR Negative  Antimicrobials:  Zosyn 12/10>> Vanc 12/10 - 12/11 Aziothro 12/10>>  Interim history/subjective:  No acute events overnight. Patient extubated to bipap this morning.  Redirectable at times but continues repeatedly to remove the mask  Objective   Blood pressure 117/67, pulse (!) 106, temperature 99.32 F (37.4 C), resp. rate (!) 22, height 5\' 6"  (1.676 m), weight 111.7 kg, SpO2 91 %.    Vent Mode: PCV;BIPAP FiO2 (%):  [40 %-60 %] 40 % Set Rate:  [12 bmp-30 bmp] 12 bmp Vt Set:  [470 mL] 470 mL PEEP:  [6 cmH20-8 cmH20] 6 cmH20 Pressure Support:  [5 cmH20] 5 cmH20 Plateau Pressure:  [22 cmH20-25 cmH20] 22 cmH20   Intake/Output Summary (Last 24 hours) at 08/03/2020 1023 Last data filed at 08/03/2020 0900 Gross per 24 hour  Intake 1578.01 ml  Output 2245 ml  Net -666.99 ml   Filed Weights   08/02/20 0004 08/02/20 0500 08/03/20 0359  Weight: 107 kg 111.1 kg 111.7 kg    Examination: General: bipap in place, no acute distress HENT: Moist mucous membranes Lungs: diminished breath sounds, no wheezing or rhonchi  Cardiovascular: tachycardic, no murmurs, gallops, rubs appreciated Abdomen: Minimally  distended, soft, BS + Extremities: no edema no erythema Neuro: moving all extremities, alert and oriented x 3, somnolent  Assessment & Plan:  Hypoxemic, hypercarbic respiratory failure:  In setting of suspected COPD exacerbation and possible pneumonia with bilateral hazy opacities on CT scan Plan: - Patient extubated this morning to bipap - Use precedex as needed to keep on bipap - May require re-intubation if she is unable to cooperate on bipap - Continue  nebulizer treatments brovana and yupelir with as needed duonebs - Continue solumedrol 60mg  daily, previously on 80mg  TID - Started on zoysen, azithromycin and vancomycin. Vancomycin discontinued with negative MRSA screen. Awaiting sputum cultures and can likely de-escalate to ceftriaxone + azithromycin  Loss of consciousness:  Suspect secondary to respiratory failure as described above. Per chart review, husband noted patient had coughing spell prior to episode, possible vasovagal event. Negative echo and head CT, low suspicion for cardiac or neurological etiologies. Per history, does not seem as though patient fell, hit her head, or had any trauma. Plan: -echo with 50-55% EF. No valvular disease. -CT head w/o contrast negative for ischemic disease.  Hx of Hypothyroidism Patient on home levothyroxine. TSH of 9.  -continue levothyroxine 75 mcg daily  Hx of HLD Continue home atorvastatin 40 mg daily  Best practice (evaluated daily)   Diet: TF  Pain/Anxiety/Delirium protocol (if indicated): Fent/Precedex/Versed VAP protocol (if indicated):  DVT prophylaxis: lovenox/SCD GI prophylaxis: protonix Glucose control: SSI Mobility: Bedrest last date of multidisciplinary goals of care discussion: N/A Family and staff present: Nurse at bedside Summary of discussion: N/A Follow up goals of care discussion due 08/02/20 Code Status: Full Code  Disposition: ICU   Labs   CBC: Recent Labs  Lab 08/01/20 2350 08/02/20 0057 08/02/20 1001 08/02/20 1053 08/02/20 1411 08/03/20 0123 08/03/20 0349  WBC 21.4*  --  24.4*  --   --  32.9*  --   NEUTROABS 9.8*  --   --   --   --   --   --   HGB 13.9   < > 13.1 14.3 14.3 12.8 12.9  HCT 44.3   < > 42.5 42.0 42.0 39.8 38.0  MCV 100.0  --  100.7*  --   --  96.6  --   PLT 380  --  319  --   --  288  --    < > = values in this interval not displayed.    Basic Metabolic Panel: Recent Labs  Lab 08/01/20 2350 08/02/20 0057 08/02/20 1001  08/02/20 1053 08/02/20 1411 08/03/20 0123 08/03/20 0349  NA 135   < > 140 138 137 136 136  K 3.4*   < > 4.0 3.9 4.2 3.6 3.5  CL 98  --  103  --   --  100  --   CO2 27  --  22  --   --  24  --   GLUCOSE 292*  --  153*  --   --  219*  --   BUN 15  --  19  --   --  23*  --   CREATININE 1.09*  --  1.33*  --   --  1.30*  --   CALCIUM 9.8  --  9.0  --   --  9.1  --   MG  --   --  1.8  --   --   --   --    < > = values in this interval not displayed.  GFR: Estimated Creatinine Clearance: 62 mL/min (A) (by C-G formula based on SCr of 1.3 mg/dL (H)). Recent Labs  Lab 08/01/20 2350 08/02/20 0151 08/02/20 1001 08/03/20 0123  PROCALCITON  --   --  6.64 7.22  WBC 21.4*  --  24.4* 32.9*  LATICACIDVEN 3.1* 1.6  --   --     Liver Function Tests: Recent Labs  Lab 08/01/20 2350 08/02/20 1001 08/03/20 0123  AST 27 25 41  ALT 26 25 28   ALKPHOS 119 92 74  BILITOT 0.7 0.8 0.5  PROT 7.5 6.5 6.4*  ALBUMIN 4.3 3.8 3.4*   Recent Labs  Lab 08/01/20 2350  LIPASE 85*   Recent Labs  Lab 08/02/20 0159  AMMONIA 57*    ABG    Component Value Date/Time   PHART 7.287 (L) 08/03/2020 0349   PCO2ART 59.0 (H) 08/03/2020 0349   PO2ART 63 (L) 08/03/2020 0349   HCO3 28.0 08/03/2020 0349   TCO2 30 08/03/2020 0349   ACIDBASEDEF 2.0 08/02/2020 1411   O2SAT 87.0 08/03/2020 0349     Coagulation Profile: Recent Labs  Lab 08/01/20 2350  INR 1.0    Cardiac Enzymes: No results for input(s): CKTOTAL, CKMB, CKMBINDEX, TROPONINI in the last 168 hours.  HbA1C: Hgb A1c MFr Bld  Date/Time Value Ref Range Status  08/02/2020 10:01 AM 5.8 (H) 4.8 - 5.6 % Final    Comment:    (NOTE) Pre diabetes:          5.7%-6.4%  Diabetes:              >6.4%  Glycemic control for   <7.0% adults with diabetes     CBG: Recent Labs  Lab 08/02/20 1923 08/02/20 2044 08/02/20 2335 08/03/20 0344 08/03/20 0730  GLUCAP 222* 203* 164* 242* 180*    Review of Systems:   Unable to assess  Past  Medical History  She,  has a past medical history of Anxiety, COPD (chronic obstructive pulmonary disease) (HCC), Depression, HLD (hyperlipidemia), HTN (hypertension), Hypothyroid, 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   Surgical History    Past Surgical History:  Procedure Laterality Date  . BACK SURGERY    . CYSTOSCOPY N/A 05/08/2020   Procedure: CYSTOSCOPY;  Surgeon: Ranae PilaLeger, Elise Jennifer, MD;  [facility] [place];  Service: Gynecology;  Laterality: N/A;  . FACIAL COSMETIC SURGERY    . RECTOCELE REPAIR N/A 05/08/2020   Procedure: POSTERIOR REPAIR (RECTOCELE), MCCALLS,;  Surgeon: Ranae PilaLeger, Elise Jennifer, MD;  [facility] [place];  Service: Gynecology;  Laterality: N/A;  . SPINAL FUSION    . TUBAL LIGATION    . VAGINAL HYSTERECTOMY N/A 05/08/2020   Procedure: HYSTERECTOMY VAGINAL;  Surgeon: Ranae PilaLeger, Elise Jennifer, MD;  [facility] ;  Service: Gynecology;  Laterality: N/A;  need bed     Social History   reports that she quit smoking about 7 months ago. She has never used smokeless tobacco. She reports that she does not drink alcohol and does not use drugs.   Family History   Her family history is not on file.   Allergies No Known Allergies   Home Medications  Prior to Admission medications   Medication Sig Start Date End Date Taking? Authorizing Provider  ANORO ELLIPTA 62.5-25 MCG/INH AEPB Inhale 1 puff into the lungs daily. 04/03/20  Yes [provider]  atorvastatin (LIPITOR) 40 MG tablet Take 40 mg by mouth  at bedtime. 03/24/20  Yes [provider]  Calcium Carb-Cholecalciferol 500-125 MG-UNIT TABS Take 1 tablet by mouth daily.   Yes [provider]  Cholecalciferol (VITAMIN D) 50 MCG (2000 UT) CAPS Take 2,000 Units by mouth daily.   Yes [provider]  desvenlafaxine (PRISTIQ) 100 MG  24 hr tablet Take 100 mg by mouth daily.   Yes [provider]  docusate sodium (COLACE) 100 MG capsule Take 1 capsule (100 mg total) by mouth 2 (two) times daily. 05/09/20  Yes Ranae Pila, MD  DULoxetine (CYMBALTA) 60 MG capsule Take 60 mg by mouth daily. 04/03/20  Yes [provider]  fluticasone (FLONASE) 50 MCG/ACT nasal spray Place 1 spray into both nostrils daily as needed for allergies or rhinitis.   Yes [provider]  ibuprofen (ADVIL) 600 MG tablet Take 1 tablet (600 mg total) by mouth every 6 (six) hours as needed. 05/09/20  Yes Ranae Pila, MD  levothyroxine (SYNTHROID) 75 MCG tablet Take 75 mcg by mouth daily before breakfast. 03/24/20  Yes [provider]  lisinopril (ZESTRIL) 20 MG tablet Take 20 mg by mouth daily. 01/07/20  Yes [provider]  Multiple Vitamin (MULTIVITAMIN WITH MINERALS) TABS tablet Take 1 tablet by mouth daily.   Yes [provider]  nicotine (NICODERM CQ - DOSED IN MG/24 HOURS) 21 mg/24hr patch Place 21 mg onto the skin daily.   Yes [provider]  traMADol (ULTRAM) 50 MG tablet Take 1 tablet (50 mg total) by mouth every 4 (four) hours as needed for moderate pain. 05/09/20  Yes Ranae Pila, MD  triamcinolone cream (KENALOG) 0.1 % Apply 1 application topically daily as needed (psoriasis).  01/03/20  Yes [provider]      Critical Care time: 45 minutes    Melody Comas, MD [facility] Office: [phone]   See Amion for Pager Details

## 2020-08-03 NOTE — Procedures (Signed)
Extubation Procedure Note  Patient Details:   Name: Marie Delacruz DOB: 09/01/1965 MRN: [id number]   Airway Documentation:    Vent end date: 08/03/20 Vent end time: 0834   Evaluation  O2 sats: stable throughout Complications: No apparent complications Patient did tolerate procedure well. Bilateral Breath Sounds: Diminished   Pt extubated to 3L [place] per MD order. Pt had positive cuff leak and no stridor noted. Attempted BIPAP per extubation order but pt very agitated and keeps pulling BIPAP off. RT to continue to monitor.  Guss Bunde 08/03/2020, 8:37 AM

## 2020-08-03 NOTE — Progress Notes (Signed)
eLink Physician-Brief Progress Note Patient Name: Marie Delacruz DOB: [birth year] MRN: [id number]   Date of Service  08/03/2020  HPI/Events of Note  Agitation - Currently on Precedex IV infusion at 1.2 mcg/kg/hour.   eICU Interventions  Plan: 1. Increase ceiling on Precedex IV infusion to 1.6 mcg/kg/min.      Intervention Category Major Interventions: Delirium, psychosis, severe agitation - evaluation and management  Lenell Antu 08/03/2020, 9:39 PM

## 2020-08-04 DIAGNOSIS — R410 Disorientation, unspecified: Secondary | ICD-10-CM

## 2020-08-04 DIAGNOSIS — J9602 Acute respiratory failure with hypercapnia: Secondary | ICD-10-CM

## 2020-08-04 DIAGNOSIS — J189 Pneumonia, unspecified organism: Principal | ICD-10-CM

## 2020-08-04 DIAGNOSIS — G928 Other toxic encephalopathy: Secondary | ICD-10-CM

## 2020-08-04 LAB — GLUCOSE, CAPILLARY
Glucose-Capillary: 131 mg/dL — ABNORMAL HIGH (ref 70–99)
Glucose-Capillary: 142 mg/dL — ABNORMAL HIGH (ref 70–99)
Glucose-Capillary: 124 mg/dL — ABNORMAL HIGH (ref 70–99)
Glucose-Capillary: 130 mg/dL — ABNORMAL HIGH (ref 70–99)
Glucose-Capillary: 141 mg/dL — ABNORMAL HIGH (ref 70–99)
Glucose-Capillary: 129 mg/dL — ABNORMAL HIGH (ref 70–99)
Glucose-Capillary: 139 mg/dL — ABNORMAL HIGH (ref 70–99)

## 2020-08-04 LAB — CBC
HCT: 38.4 % (ref 36.0–46.0)
Hemoglobin: 12.3 g/dL (ref 12.0–15.0)
MCH: 30.8 pg (ref 26.0–34.0)
MCHC: 32 g/dL (ref 30.0–36.0)
MCV: 96.2 fL (ref 80.0–100.0)
Platelets: 323 10*3/uL (ref 150–400)
RBC: 3.99 MIL/uL (ref 3.87–5.11)
RDW: 13.9 % (ref 11.5–15.5)
WBC: 28.8 10*3/uL — ABNORMAL HIGH (ref 4.0–10.5)
nRBC: 0 % (ref 0.0–0.2)

## 2020-08-04 LAB — CULTURE, RESPIRATORY W GRAM STAIN: Culture: NORMAL

## 2020-08-04 LAB — LEGIONELLA PNEUMOPHILA SEROGP 1 UR AG: L. pneumophila Serogp 1 Ur Ag: NEGATIVE

## 2020-08-04 LAB — PROCALCITONIN: Procalcitonin: 2.36 ng/mL

## 2020-08-04 MED ORDER — HYDRALAZINE HCL 20 MG/ML IJ SOLN: 10.0000 mg | Freq: Four times a day (QID) | INTRAMUSCULAR | Status: DC | PRN

## 2020-08-04 MED ORDER — MIDAZOLAM HCL 2 MG/2ML IJ SOLN: 2.0000 mg | Freq: Once | INTRAMUSCULAR | Status: DC

## 2020-08-04 MED ORDER — HALOPERIDOL LACTATE 5 MG/ML IJ SOLN
INTRAMUSCULAR | Status: AC
  Filled 2020-08-04: qty 1

## 2020-08-04 MED ORDER — HALOPERIDOL LACTATE 5 MG/ML IJ SOLN
2.0000 mg | INTRAMUSCULAR | Status: DC | PRN
  Administered 2020-08-04 – 2020-08-07 (×7): 2 mg via INTRAVENOUS
  Filled 2020-08-04 (×6): qty 1

## 2020-08-04 MED ORDER — QUETIAPINE FUMARATE 100 MG PO TABS
100.0000 mg | ORAL_TABLET | Freq: Every morning | ORAL | Status: DC
  Administered 2020-08-05: 10:00:00 100 mg via ORAL
  Filled 2020-08-04: qty 1

## 2020-08-04 MED ORDER — MIDAZOLAM HCL 2 MG/2ML IJ SOLN
INTRAMUSCULAR | Status: AC
  Administered 2020-08-04: 08:00:00 2 mg
  Filled 2020-08-04: qty 2

## 2020-08-04 MED ORDER — SODIUM CHLORIDE 0.9 % IV SOLN
2.0000 g | INTRAVENOUS | Status: AC
  Administered 2020-08-04 – 2020-08-06 (×3): 2 g via INTRAVENOUS
  Filled 2020-08-04 (×5): qty 20

## 2020-08-04 NOTE — Progress Notes (Signed)
eLink Physician-Brief Progress Note Patient Name: Marie Delacruz DOB: [birth year] MRN: [id number]   Date of Service  08/04/2020  HPI/Events of Note  Agitation - Nursing request for a Posey belt.   eICU Interventions  Plan: 1. Posey Belt X 7 hours.      Intervention Category Major Interventions: Other:  Terald Jump Dennard Nip 08/04/2020, 3:21 AM

## 2020-08-04 NOTE — Progress Notes (Signed)
eLink Physician-Brief Progress Note Patient Name: Marie Delacruz DOB: 14-Sep-1965 MRN: [id number]   Date of Service  08/04/2020  HPI/Events of Note  Agitated delirium - Currently on a Precedex IV infusion. QTc interval = 0.40 seconds.   eICU Interventions  Plan: 1. Haldol 2 ng IV now and Q 3 hours PRN agitation.  2. Monitor Qtc interval Q 4 hours. Notify MD if QTc interval > 500 milliseconds.      Intervention Category Major Interventions: Delirium, psychosis, severe agitation - evaluation and management  Adler Chartrand Eugene 08/04/2020, 12:17 AM

## 2020-08-04 NOTE — Progress Notes (Signed)
NAME:  Marie Delacruz, MRN:  [id number], DOB:  February 25, 1966, LOS: 2 ADMISSION DATE:  08/01/2020, CONSULTATION DATE:  08/02/20 REFERRING MD: Tegeler, ED , CHIEF COMPLAINT:  Loss of consciousnesss  Brief History   54 year old woman with hx of copd, depression, HTN, HLD, hypothyroidism, here after sudden loss of consciousness. Admitted for hypercapnic respiratory failure    Past Medical History   Past Medical History:  Diagnosis Date  . Anxiety   . COPD (chronic obstructive pulmonary disease) (HCC)   . Depression   . HLD (hyperlipidemia)   . HTN (hypertension)   . Hypothyroid   . 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 Obesity   . PONV (postoperative nausea and vomiting)   . Prolapse of posterior vaginal wall      Past Surgical History:  Procedure Laterality Date  . BACK SURGERY    . CYSTOSCOPY N/A 05/08/2020   Procedure: CYSTOSCOPY;  Surgeon: Ranae Pila, MD;  Location: [facility];  Service: Gynecology;  Laterality: N/A;  . FACIAL COSMETIC SURGERY    . RECTOCELE REPAIR N/A 05/08/2020   Procedure: POSTERIOR REPAIR (RECTOCELE), MCCALLS,;  Surgeon: Ranae Pila, MD;  Location: [facility];  Service: Gynecology;  Laterality: N/A;  . SPINAL FUSION    . TUBAL LIGATION    . VAGINAL HYSTERECTOMY N/A 05/08/2020   Procedure: HYSTERECTOMY VAGINAL;  Surgeon: Ranae Pila, MD;  Location: [facility];  Service: Gynecology;  Laterality: N/A;  need bed     Significant Hospital Events   08/01/20 Admitted 08/03/20 Extuabted to bipap  Consults:  None  Procedures:  Intubation 12/11 - 12/12  Significant Diagnostic Tests:  CXR 12/10 - No acute cardiopulmonary disease  CT chest 12/11 1. No acute pulmonary embolism. 2. Hazy bilateral ground-glass airspace opacities, concerning for an atypical infectious process such as viral pneumonia or  developing pulmonary edema. 3. Trace right-sided pleural effusion.  CT Head W/O C 12/11 - No intracranial abnormality, Acute sphenoid sinusitis  Echo:  EF 50-55%, no valvular disease.  Micro Data:  Influenza neg Covid neg BC x 2 12/10 - no growth UC - no growth Respiratory Panel PCR - negative MRSA PCR Negative  Antimicrobials:  Zosyn 12/10>> DC 12/13 Vanc 12/10 - 12/11 Aziothro 12/10>> Ceftriaxone 12/13>>  Interim history/subjective:  Patient extubated yesterday. Increased agitation overnight, requiring IV Haldol PRN and posey belt x 7 hours. At bedside this morning patient with agitation but was redirectable and alert.   Objective   Blood pressure (!) 140/102, pulse (!) 51, temperature (!) 97.2 F (36.2 C), temperature source Axillary, resp. rate 17, height 5\' 6"  (1.676 m), weight 111.4 kg, SpO2 97 %.    Vent Mode: PCV;BIPAP FiO2 (%):  [40 %-50 %] 40 % Set Rate:  [12 bmp-30 bmp] 12 bmp Vt Set:  [470 mL] 470 mL PEEP:  [6 cmH20-8 cmH20] 6 cmH20 Pressure Support:  [5 cmH20] 5 cmH20 Plateau Pressure:  [22 cmH20] 22 cmH20   Intake/Output Summary (Last 24 hours) at 08/04/2020 0647 Last data filed at 08/04/2020 0600 Gross per 24 hour  Intake 1263.81 ml  Output 1389 ml  Net -125.19 ml   Filed Weights   08/02/20 0500 08/03/20 0359 08/04/20 0309  Weight: 111.1 kg 111.7 kg 111.4 kg    Examination: General: Nasal canula in place, patient with increasing agitation. Redirectable HENT: Moist mucous membranes Lungs:  diminished breath sounds, no wheezing or rhonchi  Cardiovascular: tachycardic, no murmurs, gallops, rubs appreciated Abdomen: Minimally distended, soft, BS + Extremities: no edema no erythema Neuro: moving all extremities, distracted but redirectable. Responds to name. Able to calm patient with talking with her.  Assessment & Plan:  Hypoxemic, hypercarbic respiratory failure:  In setting of suspected COPD exacerbation and possible pneumonia with bilateral  hazy opacities on CT scan Plan: -patient extubated 08/03/2020 -may require re-intubation if she is unable to cooperate and has increasing episodes of agitation or if she appears to be unable to protect her airway. High risk, will continue to monitor in ICU -do not suspect agitation secondary to hypercapnia.  -continue nebulizer treatments brovana and yupelir with as needed duonebs -continue solumedrol 60mg  daily, previously on 80mg  TID -started on zoysen, azithromycin and vancomycin. Vancomycin discontinued with negative MRSA screen. Sputum cultures positive for abundant WBC, gram - coccobacilli, few + cocci. Will narrow coverage for CAP, ceftriaxone started, will continue azithromycin. -CBC pending  Agitation Suspect secondary to hospital delirium. Patient redirectable during interactions. Will assess if she has improvement with husband at bedside. Not requiring restraints at this time.  Plan: -continue PRN haldol, add 100 mg seroquel this morning and see if any improvement. Consider nightly serqoeul as well.  -do not suspect hypercapnia at this time, patient is not disoriented/confused -QTC of 387, will continue to monitor for lengthening.   Loss of consciousness:  Suspect secondary to respiratory failure as described above. Per chart review, husband noted patient had coughing spell prior to episode, possible vasovagal event. Negative echo and head CT, low suspicion for cardiac or neurological etiologies. Per history, does not seem as though patient fell, hit her head, or had any trauma. Plan: -echo with 50-55% EF. No valvular disease. -CT head w/o contrast negative for ischemic disease.  Hx of Hypothyroidism Patient on home levothyroxine. TSH of 9.  -continue levothyroxine 75 mcg daily  Hx of HLD Continue home atorvastatin 40 mg daily  Best practice (evaluated daily)   Diet: TF  Pain/Anxiety/Delirium protocol (if indicated): PRN Haldol, seroquel  VAP protocol (if indicated):  N/A DVT prophylaxis: lovenox/SCD GI prophylaxis: protonix Glucose control: SSI Mobility: Bedrest last date of multidisciplinary goals of care discussion: N/A Family and staff present: Nurse at bedside Summary of discussion: N/A Follow up goals of care discussion due 08/05/20 Code Status: Full Code  Disposition: ICU   Labs   CBC: Recent Labs  Lab 08/01/20 2350 08/02/20 0057 08/02/20 1001 08/02/20 1053 08/02/20 1411 08/03/20 0123 08/03/20 0349  WBC 21.4*  --  24.4*  --   --  32.9*  --   NEUTROABS 9.8*  --   --   --   --   --   --   HGB 13.9   < > 13.1 14.3 14.3 12.8 12.9  HCT 44.3   < > 42.5 42.0 42.0 39.8 38.0  MCV 100.0  --  100.7*  --   --  96.6  --   PLT 380  --  319  --   --  288  --    < > = values in this interval not displayed.    Basic Metabolic Panel: Recent Labs  Lab 08/01/20 2350 08/02/20 0057 08/02/20 1001 08/02/20 1053 08/02/20 1411 08/03/20 0123 08/03/20 0349  NA 135   < > 140 138 137 136 136  K 3.4*   < > 4.0 3.9 4.2 3.6 3.5  CL 98  --  103  --   --  100  --   CO2 27  --  22  --   --  24  --   GLUCOSE 292*  --  153*  --   --  219*  --   BUN 15  --  19  --   --  23*  --   CREATININE 1.09*  --  1.33*  --   --  1.30*  --   CALCIUM 9.8  --  9.0  --   --  9.1  --   MG  --   --  1.8  --   --   --   --    < > = values in this interval not displayed.   GFR: Estimated Creatinine Clearance: 61.8 mL/min (A) (by C-G formula based on SCr of 1.3 mg/dL (H)). Recent Labs  Lab 08/01/20 2350 08/02/20 0151 08/02/20 1001 08/03/20 0123 08/04/20 0230  PROCALCITON  --   --  6.64 7.22 2.36  WBC 21.4*  --  24.4* 32.9*  --   LATICACIDVEN 3.1* 1.6  --   --   --     Liver Function Tests: Recent Labs  Lab 08/01/20 2350 08/02/20 1001 08/03/20 0123  AST 27 25 41  ALT 26 25 28   ALKPHOS 119 92 74  BILITOT 0.7 0.8 0.5  PROT 7.5 6.5 6.4*  ALBUMIN 4.3 3.8 3.4*   Recent Labs  Lab 08/01/20 2350  LIPASE 85*   Recent Labs  Lab 08/02/20 0159  AMMONIA 57*     ABG    Component Value Date/Time   PHART 7.287 (L) 08/03/2020 0349   PCO2ART 59.0 (H) 08/03/2020 0349   PO2ART 63 (L) 08/03/2020 0349   HCO3 28.0 08/03/2020 0349   TCO2 30 08/03/2020 0349   ACIDBASEDEF 2.0 08/02/2020 1411   O2SAT 87.0 08/03/2020 0349     Coagulation Profile: Recent Labs  Lab 08/01/20 2350  INR 1.0    Cardiac Enzymes: No results for input(s): CKTOTAL, CKMB, CKMBINDEX, TROPONINI in the last 168 hours.  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   Surgical History    Past Surgical History:  Procedure Laterality Date  . BACK SURGERY    . CYSTOSCOPY N/A 05/08/2020   Procedure: CYSTOSCOPY;  Surgeon: 05/10/2020, MD;  [facility];  Service: Gynecology;  Laterality: N/A;  . FACIAL COSMETIC SURGERY    . RECTOCELE REPAIR N/A 05/08/2020   Procedure: POSTERIOR REPAIR (RECTOCELE), MCCALLS,;  Surgeon: 05/10/2020, MD;  Location: [facility];  Service: Gynecology;  Laterality: N/A;  . SPINAL FUSION    . TUBAL LIGATION    . VAGINAL HYSTERECTOMY N/A  05/08/2020   Procedure: HYSTERECTOMY VAGINAL;  Surgeon: Ranae PilaLeger, Elise Jennifer, MD;  [facility];  Service: Gynecology;   Laterality: N/A;  need bed     Social History   reports that she quit smoking about 7 months ago. She has never used smokeless tobacco. She reports that she does not drink alcohol and does not use drugs.   Family History   Her family history is not on file.   Allergies No Known Allergies   Home Medications  Prior to Admission medications   Medication Sig Start Date End Date Taking? Authorizing Provider  ANORO ELLIPTA 62.5-25 MCG/INH AEPB Inhale 1 puff into the lungs daily. 04/03/20  Yes [provider]  atorvastatin (LIPITOR) 40 MG tablet Take 40 mg by mouth at bedtime. 03/24/20  Yes [provider]  Calcium Carb-Cholecalciferol 500-125 MG-UNIT TABS Take 1 tablet by mouth daily.   Yes [provider]  Cholecalciferol (VITAMIN D) 50 MCG (2000 UT) CAPS Take 2,000 Units by mouth daily.   Yes [provider]  desvenlafaxine (PRISTIQ) 100 MG 24 hr tablet Take 100 mg by mouth daily.   Yes [provider]  docusate sodium (COLACE) 100 MG capsule Take 1 capsule (100 mg total) by mouth 2 (two) times daily. 05/09/20  Yes Ranae PilaLeger, Elise Jennifer, MD  DULoxetine (CYMBALTA) 60 MG capsule Take 60 mg by mouth daily. 04/03/20  Yes [provider]  fluticasone (FLONASE) 50 MCG/ACT nasal spray Place 1 spray into both nostrils daily as needed for allergies or rhinitis.   Yes [provider]  ibuprofen (ADVIL) 600 MG tablet Take 1 tablet (600 mg total) by mouth every 6 (six) hours as needed. 05/09/20  Yes Ranae PilaLeger, Elise Jennifer, MD  levothyroxine (SYNTHROID) 75 MCG tablet Take 75 mcg by mouth daily before breakfast. 03/24/20  Yes [provider]  lisinopril (ZESTRIL) 20 MG tablet Take 20 mg by mouth daily. 01/07/20  Yes [provider]  Multiple Vitamin (MULTIVITAMIN WITH MINERALS) TABS tablet Take 1 tablet by mouth daily.   Yes [provider]  nicotine (NICODERM CQ - DOSED IN MG/24 HOURS) 21 mg/24hr patch Place 21 mg onto the skin  daily.   Yes [provider]  traMADol (ULTRAM) 50 MG tablet Take 1 tablet (50 mg total) by mouth every 4 (four) hours as needed for moderate pain. 05/09/20  Yes Ranae PilaLeger, Elise Jennifer, MD  triamcinolone cream (KENALOG) 0.1 % Apply 1 application topically daily as needed (psoriasis).  01/03/20  Yes [provider]     Thalia BloodgoodVasili Marites Nath DO Internal Medicine Resident PGY-1 [facility]

## 2020-08-05 LAB — COMPREHENSIVE METABOLIC PANEL
ALT: 37 U/L (ref 0–44)
AST: 42 U/L — ABNORMAL HIGH (ref 15–41)
Albumin: 3.3 g/dL — ABNORMAL LOW (ref 3.5–5.0)
Alkaline Phosphatase: 75 U/L (ref 38–126)
Anion gap: 9 (ref 5–15)
BUN: 42 mg/dL — ABNORMAL HIGH (ref 6–20)
CO2: 26 mmol/L (ref 22–32)
Calcium: 9.5 mg/dL (ref 8.9–10.3)
Chloride: 105 mmol/L (ref 98–111)
Creatinine, Ser: 0.92 mg/dL (ref 0.44–1.00)
GFR, Estimated: >60 mL/min (ref >60)
Glucose, Bld: 136 mg/dL — ABNORMAL HIGH (ref 70–99)
Potassium: 3.7 mmol/L (ref 3.5–5.1)
Sodium: 140 mmol/L (ref 135–145)
Total Bilirubin: 0.5 mg/dL (ref 0.3–1.2)
Total Protein: 6.2 g/dL — ABNORMAL LOW (ref 6.5–8.1)

## 2020-08-05 LAB — GLUCOSE, CAPILLARY
Glucose-Capillary: 126 mg/dL — ABNORMAL HIGH (ref 70–99)
Glucose-Capillary: 104 mg/dL — ABNORMAL HIGH (ref 70–99)
Glucose-Capillary: 125 mg/dL — ABNORMAL HIGH (ref 70–99)
Glucose-Capillary: 154 mg/dL — ABNORMAL HIGH (ref 70–99)
Glucose-Capillary: 132 mg/dL — ABNORMAL HIGH (ref 70–99)
Glucose-Capillary: 77 mg/dL (ref 70–99)

## 2020-08-05 LAB — CBC
HCT: 36.7 % (ref 36.0–46.0)
Hemoglobin: 12.5 g/dL (ref 12.0–15.0)
MCH: 32.2 pg (ref 26.0–34.0)
MCHC: 34.1 g/dL (ref 30.0–36.0)
MCV: 94.6 fL (ref 80.0–100.0)
Platelets: 350 10*3/uL (ref 150–400)
RBC: 3.88 MIL/uL (ref 3.87–5.11)
RDW: 13.7 % (ref 11.5–15.5)
WBC: 23 10*3/uL — ABNORMAL HIGH (ref 4.0–10.5)
nRBC: 0 % (ref 0.0–0.2)

## 2020-08-05 LAB — TRIGLYCERIDES: Triglycerides: 189 mg/dL — ABNORMAL HIGH (ref <150)

## 2020-08-05 MED ORDER — RESOURCE THICKENUP CLEAR PO POWD
ORAL | Status: DC | PRN
  Filled 2020-08-05 (×2): qty 125

## 2020-08-05 MED ORDER — HALOPERIDOL LACTATE 5 MG/ML IJ SOLN
2.0000 mg | Freq: Once | INTRAMUSCULAR | Status: AC
  Administered 2020-08-05: 23:00:00 2 mg via INTRAVENOUS
  Filled 2020-08-05: qty 1

## 2020-08-05 MED ORDER — STARCH (THICKENING) PO POWD
ORAL | Status: DC | PRN
  Filled 2020-08-05: qty 227

## 2020-08-05 NOTE — Evaluation (Signed)
Clinical/Bedside Swallow Evaluation Patient Details  Name: Marie Delacruz MRN: [id number] Date of Birth: Apr 16, 1966  Today's Date: 08/05/2020 Time: SLP Start Time (ACUTE ONLY): 1301 SLP Stop Time (ACUTE ONLY): 1325 SLP Time Calculation (min) (ACUTE ONLY): 24 min  Past Medical History:  Past Medical History:  Diagnosis Date  . Anxiety   . COPD (chronic obstructive pulmonary disease) (HCC)   . Depression   . HLD (hyperlipidemia)   . HTN (hypertension)   . Hypothyroid   . 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 Obesity   . PONV (postoperative nausea and vomiting)   . Prolapse of posterior vaginal wall    Past Surgical History:  Past Surgical History:  Procedure Laterality Date  . BACK SURGERY    . CYSTOSCOPY N/A 05/08/2020   Procedure: CYSTOSCOPY;  Surgeon: Ranae Pila, MD;  Location: [facility];  Service: Gynecology;  Laterality: N/A;  . FACIAL COSMETIC SURGERY    . RECTOCELE REPAIR N/A 05/08/2020   Procedure: POSTERIOR REPAIR (RECTOCELE), MCCALLS,;  Surgeon: Ranae Pila, MD;  Location: [facility];  Service: Gynecology;  Laterality: N/A;  . SPINAL FUSION    . TUBAL LIGATION    . VAGINAL HYSTERECTOMY N/A 05/08/2020   Procedure: HYSTERECTOMY VAGINAL;  Surgeon: Ranae Pila, MD;  Location: [facility];  Service: Gynecology;  Laterality: N/A;  need bed   HPI:  54 year old woman with hx of copd, depression, HTN, HLD, hypothyroidism, here after sudden loss of consciousness.  ETT 12/11-12/12.   Assessment / Plan / Recommendation Clinical Impression  Pt presents with a suspected oropharyngeal dysphagia post extubation and likely impacted from current cognitive deficits. Pt stimulable for improved alertness with cueing. Overt coughing exhibited following consecutive straw sips of thin liquids concerning for poor airway protection. No overt  s/sx of aspiration with nectar (mildly thick) liquids, puree, or solid PO. Prolonged mastication and decreased bolus cohesion noted with solids. Recommend conservative dysphagia 1 (puree) and nectar thick liquids with meds in puree. Pt requires full supervision and feeding assistance with current cognitive status. SLP to follow up for diet tolerance/ advancement.  SLP Visit Diagnosis: Dysphagia, unspecified (R13.10)    Aspiration Risk  Moderate aspiration risk    Diet Recommendation   Dysphagia 1 (puree), nectar (mildly thick) liquids   Medication Administration: Whole meds with puree    Other  Recommendations Oral Care Recommendations: Oral care BID Other Recommendations: Order thickener from pharmacy   Follow up Recommendations        Frequency and Duration min 2x/week  1 week       Prognosis Prognosis for Safe Diet Advancement: Good Barriers to Reach Goals: Time post onset;Cognitive deficits      Swallow Study   General Date of Onset: 08/02/20 HPI: 54 year old woman with hx of copd, depression, HTN, HLD, hypothyroidism, here after sudden loss of consciousness.  ETT 12/11-12/12. Type of Study: Bedside Swallow Evaluation Previous Swallow Assessment: none on file Diet Prior to this Study: NPO Temperature Spikes Noted: No Respiratory Status: Nasal cannula History of Recent Intubation: Yes Length of Intubations (days): 1 days Date extubated: 08/03/20 Behavior/Cognition: Requires cueing;Distractible;Lethargic/Drowsy;Cooperative Oral Cavity Assessment: Dry Oral Care Completed by SLP: Yes Oral Cavity - Dentition: Adequate natural dentition Self-Feeding Abilities: Needs assist Patient Positioning: Upright in bed Baseline Vocal Quality: Low vocal intensity Volitional Cough: Weak Volitional Swallow: Unable to elicit  Oral/Motor/Sensory Function Overall Oral Motor/Sensory Function: Generalized oral weakness   Ice Chips Ice chips: Impaired Presentation: Spoon Oral Phase  Impairments: Reduced labial seal Oral Phase Functional Implications: Prolonged oral transit Pharyngeal Phase Impairments: Suspected delayed Swallow;Multiple swallows   Thin Liquid Thin Liquid: Impaired Presentation: Cup;Straw Oral Phase Impairments: Reduced labial seal Oral Phase Functional Implications: Prolonged oral transit Pharyngeal  Phase Impairments: Suspected delayed Swallow;Multiple swallows;Cough - Immediate;Cough - Delayed;Wet Vocal Quality    Nectar Thick Nectar Thick Liquid: Impaired Presentation: Cup;Straw Oral Phase Impairments: Reduced labial seal Oral phase functional implications: Prolonged oral transit Pharyngeal Phase Impairments: Suspected delayed Swallow;Multiple swallows   Honey Thick Honey Thick Liquid: Not tested   Puree Puree: Within functional limits   Solid     Solid: Impaired Presentation: Spoon Oral Phase Impairments: Impaired mastication;Reduced lingual movement/coordination Oral Phase Functional Implications: Prolonged oral transit Pharyngeal Phase Impairments: Suspected delayed Swallow;Multiple swallows      Oasis Goehring E Jenavie Stanczak MA, CCC-SLP Acute Rehabilitation Services  08/05/2020,1:37 PM

## 2020-08-05 NOTE — Progress Notes (Signed)
NAME:  Marie Delacruz, MRN:  [id number], DOB:  04/07/1966, LOS: 3 ADMISSION DATE:  08/01/2020, CONSULTATION DATE:  08/02/20 REFERRING MD: Tegeler, ED , CHIEF COMPLAINT:  Loss of consciousnesss  Brief History   54 year old woman with hx of copd, depression, HTN, HLD, hypothyroidism, here after sudden loss of consciousness. Admitted for hypercapnic respiratory failure    Past Medical History   Past Medical History:  Diagnosis Date  . Anxiety   . COPD (chronic obstructive pulmonary disease) (HCC)   . Depression   . HLD (hyperlipidemia)   . HTN (hypertension)   . Hypothyroid   . 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 Obesity   . PONV (postoperative nausea and vomiting)   . Prolapse of posterior vaginal wall      Past Surgical History:  Procedure Laterality Date  . BACK SURGERY    . CYSTOSCOPY N/A 05/08/2020   Procedure: CYSTOSCOPY;  Surgeon: Ranae PilaLeger, Elise Jennifer, MD;  [facility];  Service: Gynecology;  Laterality: N/A;  . FACIAL COSMETIC SURGERY    . RECTOCELE REPAIR N/A 05/08/2020   Procedure: POSTERIOR REPAIR (RECTOCELE), MCCALLS,;  Surgeon: Ranae PilaLeger, Elise Jennifer, MD;  [facility];  Service: Gynecology;  Laterality: N/A;  . SPINAL FUSION    . TUBAL LIGATION    . VAGINAL HYSTERECTOMY N/A 05/08/2020   Procedure: HYSTERECTOMY VAGINAL;  Surgeon: Ranae PilaLeger, Elise Jennifer, MD;  [facility];  Service: Gynecology;  Laterality: N/A;  need bed     Significant Hospital Events   08/01/20 Admitted 08/03/20 Extuabted to bipap  Consults:  None  Procedures:  Intubation 12/11 - 12/12  Significant Diagnostic Tests:  CXR 12/10 - No acute cardiopulmonary disease  CT chest 12/11 1. No acute pulmonary embolism. 2. Hazy bilateral ground-glass airspace opacities, concerning for an atypical infectious process such as viral pneumonia or  developing pulmonary edema. 3. Trace right-sided pleural effusion.  CT Head W/O C 12/11 - No intracranial abnormality, Acute sphenoid sinusitis  Echo:  EF 50-55%, no valvular disease.  Micro Data:  Influenza neg Covid neg BC x 2 12/10 - no growth UC - no growth Respiratory Panel PCR - negative MRSA PCR Negative Sputum Cx 08/02/20 - Abundant WBC, PMN's. Moderate gram - coccobacilli and few gram + rods.   Antimicrobials:  Zosyn 12/10>> DC 12/13 Vanc 12/10 - 12/11 Aziothro 12/10>> Ceftriaxone 12/13>>  Interim history/subjective:  Patient extubated yesterday. Increased agitation overnight, requiring IV Haldol PRN and posey belt x 7 hours. At bedside this morning patient with agitation but was redirectable and alert.   Objective   Blood pressure (!) 187/105, pulse 67, temperature 97.8 F (36.6 C), temperature source Axillary, resp. rate (!) 24, height 5\' 6"  (1.676 m), weight 112.6 kg, SpO2 94 %.        Intake/Output Summary (Last 24 hours) at 08/05/2020 0732 Last data filed at 08/05/2020 0600 Gross per 24 hour  Intake 1136.63 ml  Output 625 ml  Net 511.63 ml   Filed Weights   08/03/20 0359 08/04/20 0309 08/05/20 0341  Weight: 111.7 kg 111.4 kg 112.6 kg    Examination: General: Nasal canula in place, patient with increasing agitation. Redirectable HENT: Moist mucous membranes Lungs: diminished breath sounds, no wheezing or rhonchi  Cardiovascular: tachycardic, no murmurs, gallops, rubs appreciated Abdomen: Minimally distended, soft, BS + Extremities: no edema no erythema Neuro: moving all  extremities, bilateral soft wrist restraints in place.   Assessment & Plan:  Hypoxemic, hypercarbic respiratory failure:  In setting of suspected COPD exacerbation and possible pneumonia with bilateral hazy opacities on CT scan Plan: -patient extubated 08/03/2020 -despite increased agitation, patient seems to be protecting her airway and handling secretions.  -do not suspect  agitation secondary to hypercapnia.  -continue nebulizer treatments brovana and yupelir with as needed duonebs. Received dose this morning for wheezing. -continue solumedrol 60mg  daily, previously on 80mg  TID -continue azithromycin and ceftriaxone. DC vanc coverage, MRSA PCR negative -CBC improving  Agitation Suspect secondary to hospital delirium. Patient redirectable during interactions and improves with family at bedside. Unable to give patient PO serqoruel as unsure if she is able to safely swallow.  Plan: -continue PRN haldol. Speech evaluation pending at this time. If unable to safetly swallow, NG tube to be placed.  -do not suspect hypercapnia at this time, patient is not disoriented/confused -QTC of 387 yesterday.   Loss of consciousness:  Suspect secondary to respiratory failure as described above. Per chart review, husband noted patient had coughing spell prior to episode, possible vasovagal event. Negative echo and head CT, low suspicion for cardiac or neurological etiologies. Per history, does not seem as though patient fell, hit her head, or had any trauma. Plan: -echo with 50-55% EF. No valvular disease. -CT head w/o contrast negative for ischemic disease.  Hx of Hypothyroidism Patient on home levothyroxine. TSH of 9.  -continue levothyroxine 75 mcg daily  Hx of HLD Continue home atorvastatin 40 mg daily  Best practice (evaluated daily)   Diet: TF  Pain/Anxiety/Delirium protocol (if indicated): PRN Haldol, seroquel  VAP protocol (if indicated): N/A DVT prophylaxis: lovenox/SCD GI prophylaxis: protonix Glucose control: SSI Mobility: Bedrest last date of multidisciplinary goals of care discussion: N/A Family and staff present: Nurse at bedside Summary of discussion: N/A Follow up goals of care discussion due 08/05/20 Code Status: Full Code  Disposition: ICU   Labs   CBC: Recent Labs  Lab 08/01/20 2350 08/02/20 0057 08/02/20 1001 08/02/20 1053  08/02/20 1411 08/03/20 0123 08/03/20 0349 08/04/20 0955 08/05/20 0053  WBC 21.4*  --  24.4*  --   --  32.9*  --  28.8* 23.0*  NEUTROABS 9.8*  --   --   --   --   --   --   --   --   HGB 13.9   < > 13.1   < > 14.3 12.8 12.9 12.3 12.5  HCT 44.3   < > 42.5   < > 42.0 39.8 38.0 38.4 36.7  MCV 100.0  --  100.7*  --   --  96.6  --  96.2 94.6  PLT 380  --  319  --   --  288  --  323 350   < > = values in this interval not displayed.    Basic Metabolic Panel: Recent Labs  Lab 08/01/20 2350 08/02/20 0057 08/02/20 1001 08/02/20 1053 08/02/20 1411 08/03/20 0123 08/03/20 0349 08/05/20 0053  NA 135   < > 140 138 137 136 136 140  K 3.4*   < > 4.0 3.9 4.2 3.6 3.5 3.7  CL 98  --  103  --   --  100  --  105  CO2 27  --  22  --   --  24  --  26  GLUCOSE 292*  --  153*  --   --  219*  --  136*  BUN 15  --  19  --   --  23*  --  42*  CREATININE 1.09*  --  1.33*  --   --  1.30*  --  0.92  CALCIUM 9.8  --  9.0  --   --  9.1  --  9.5  MG  --   --  1.8  --   --   --   --   --    < > = values in this interval not displayed.   GFR: Estimated Creatinine Clearance: 87.9 mL/min (by C-G formula based on SCr of 0.92 mg/dL). Recent Labs  Lab 08/01/20 2350 08/02/20 0151 08/02/20 1001 08/03/20 0123 08/04/20 0230 08/04/20 0955 08/05/20 0053  PROCALCITON  --   --  6.64 7.22 2.36  --   --   WBC 21.4*  --  24.4* 32.9*  --  28.8* 23.0*  LATICACIDVEN 3.1* 1.6  --   --   --   --   --     Liver Function Tests: Recent Labs  Lab 08/01/20 2350 08/02/20 1001 08/03/20 0123 08/05/20 0053  AST 27 25 41 42*  ALT 26 25 28  37  ALKPHOS 119 92 74 75  BILITOT 0.7 0.8 0.5 0.5  PROT 7.5 6.5 6.4* 6.2*  ALBUMIN 4.3 3.8 3.4* 3.3*   Recent Labs  Lab 08/01/20 2350  LIPASE 85*   Recent Labs  Lab 08/02/20 0159  AMMONIA 57*    ABG    Component Value Date/Time   PHART 7.287 (L) 08/03/2020 0349   PCO2ART 59.0 (H) 08/03/2020 0349   PO2ART 63 (L) 08/03/2020 0349   HCO3 28.0 08/03/2020 0349   TCO2 30  08/03/2020 0349   ACIDBASEDEF 2.0 08/02/2020 1411   O2SAT 87.0 08/03/2020 0349     Coagulation Profile: Recent Labs  Lab 08/01/20 2350  INR 1.0    Cardiac Enzymes: No results for input(s): CKTOTAL, CKMB, CKMBINDEX, TROPONINI in the last 168 hours.  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   Surgical History    Past Surgical History:  Procedure Laterality Date  . BACK SURGERY    . CYSTOSCOPY N/A 05/08/2020   Procedure: CYSTOSCOPY;  Surgeon: 05/10/2020, MD;  [facility];  Service: Gynecology;  Laterality: N/A;  . FACIAL COSMETIC SURGERY    . RECTOCELE REPAIR N/A 05/08/2020   Procedure: POSTERIOR REPAIR (RECTOCELE), MCCALLS,;  Surgeon: 05/10/2020, MD;  Location: [facility];  Service: Gynecology;  Laterality: N/A;  . SPINAL FUSION    . TUBAL LIGATION    . VAGINAL HYSTERECTOMY N/A 05/08/2020   Procedure: HYSTERECTOMY VAGINAL;  Surgeon: 05/10/2020,  Madelaine Etienne, MD;  [facility];  Service: Gynecology;  Laterality: N/A;  need bed     Social History   reports that she quit smoking about 7 months ago. She has never used smokeless tobacco. She reports that she does not drink alcohol and does  not use drugs.   Family History   Her family history is not on file.   Allergies No Known Allergies   Home Medications  Prior to Admission medications   Medication Sig Start Date End Date Taking? Authorizing Provider  ANORO ELLIPTA 62.5-25 MCG/INH AEPB Inhale 1 puff into the lungs daily. 04/03/20  Yes [provider]  atorvastatin (LIPITOR) 40 MG tablet Take 40 mg by mouth at bedtime. 03/24/20  Yes [provider]  Calcium Carb-Cholecalciferol 500-125 MG-UNIT TABS Take 1 tablet by mouth daily.   Yes [provider]  Cholecalciferol (VITAMIN D) 50 MCG (2000 UT) CAPS Take 2,000 Units by mouth daily.   Yes [provider]  desvenlafaxine (PRISTIQ) 100 MG 24 hr tablet Take 100 mg by mouth daily.   Yes [provider]  docusate sodium (COLACE) 100 MG capsule Take 1 capsule (100 mg total) by mouth 2 (two) times daily. 05/09/20  Yes Ranae Pila, MD  DULoxetine (CYMBALTA) 60 MG capsule Take 60 mg by mouth daily. 04/03/20  Yes [provider]  fluticasone (FLONASE) 50 MCG/ACT nasal spray Place 1 spray into both nostrils daily as needed for allergies or rhinitis.   Yes [provider]  ibuprofen (ADVIL) 600 MG tablet Take 1 tablet (600 mg total) by mouth every 6 (six) hours as needed. 05/09/20  Yes Ranae Pila, MD  levothyroxine (SYNTHROID) 75 MCG tablet Take 75 mcg by mouth daily before breakfast. 03/24/20  Yes [provider]  lisinopril (ZESTRIL) 20 MG tablet Take 20 mg by mouth daily. 01/07/20  Yes [provider]  Multiple Vitamin (MULTIVITAMIN WITH MINERALS) TABS tablet Take 1 tablet by mouth daily.   Yes [provider]  nicotine (NICODERM CQ - DOSED IN MG/24 HOURS) 21 mg/24hr patch Place 21 mg onto the skin daily.   Yes [provider]  traMADol (ULTRAM) 50 MG tablet Take 1 tablet (50 mg total) by mouth every 4 (four) hours as needed for moderate pain. 05/09/20  Yes Ranae Pila, MD  triamcinolone cream (KENALOG) 0.1 % Apply 1 application topically daily as needed (psoriasis).  01/03/20  Yes [provider]     Thalia Bloodgood DO Internal Medicine Resident PGY-1 [facility]

## 2020-08-05 NOTE — Progress Notes (Signed)
eLink Physician-Brief Progress Note Patient Name: Marie Delacruz DOB: 03-27-1966 MRN: [id number]   Date of Service  08/05/2020  HPI/Events of Note  Severe Agitation - Patient is on a Precedex IV infusion, Haldol IV PRN and Seroquel per tube. Last Haldol 2 mg IV at 9:36 PM. Qtc interval = 0.43 seconds.   eICU Interventions  Plan: 1. Haldol 2 mg IV now. (Extra dose)     Intervention Category Major Interventions: Delirium, psychosis, severe agitation - evaluation and management  Pasty Manninen [place] 08/05/2020, 10:34 PM

## 2020-08-06 LAB — COMPREHENSIVE METABOLIC PANEL
ALT: 41 U/L (ref 0–44)
AST: 38 U/L (ref 15–41)
Albumin: 3.4 g/dL — ABNORMAL LOW (ref 3.5–5.0)
Alkaline Phosphatase: 66 U/L (ref 38–126)
Anion gap: 12 (ref 5–15)
BUN: 36 mg/dL — ABNORMAL HIGH (ref 6–20)
CO2: 25 mmol/L (ref 22–32)
Calcium: 9.5 mg/dL (ref 8.9–10.3)
Chloride: 105 mmol/L (ref 98–111)
Creatinine, Ser: 0.89 mg/dL (ref 0.44–1.00)
GFR, Estimated: >60 mL/min (ref >60)
Glucose, Bld: 93 mg/dL (ref 70–99)
Potassium: 3.6 mmol/L (ref 3.5–5.1)
Sodium: 142 mmol/L (ref 135–145)
Total Bilirubin: 1 mg/dL (ref 0.3–1.2)
Total Protein: 6.2 g/dL — ABNORMAL LOW (ref 6.5–8.1)

## 2020-08-06 LAB — CBC
HCT: 36.4 % (ref 36.0–46.0)
Hemoglobin: 12.3 g/dL (ref 12.0–15.0)
MCH: 31.6 pg (ref 26.0–34.0)
MCHC: 33.8 g/dL (ref 30.0–36.0)
MCV: 93.6 fL (ref 80.0–100.0)
Platelets: 324 10*3/uL (ref 150–400)
RBC: 3.89 MIL/uL (ref 3.87–5.11)
RDW: 13.8 % (ref 11.5–15.5)
WBC: 16 10*3/uL — ABNORMAL HIGH (ref 4.0–10.5)
nRBC: 0 % (ref 0.0–0.2)

## 2020-08-06 LAB — GLUCOSE, CAPILLARY
Glucose-Capillary: 118 mg/dL — ABNORMAL HIGH (ref 70–99)
Glucose-Capillary: 122 mg/dL — ABNORMAL HIGH (ref 70–99)
Glucose-Capillary: 111 mg/dL — ABNORMAL HIGH (ref 70–99)
Glucose-Capillary: 82 mg/dL (ref 70–99)
Glucose-Capillary: 85 mg/dL (ref 70–99)
Glucose-Capillary: 108 mg/dL — ABNORMAL HIGH (ref 70–99)

## 2020-08-06 MED ORDER — PANTOPRAZOLE SODIUM 40 MG PO TBEC
40.0000 mg | DELAYED_RELEASE_TABLET | Freq: Every day | ORAL | Status: DC
  Administered 2020-08-07 – 2020-08-10 (×4): 40 mg via ORAL
  Filled 2020-08-06 (×4): qty 1

## 2020-08-06 MED ORDER — VENLAFAXINE HCL ER 75 MG PO CP24
150.0000 mg | ORAL_CAPSULE | Freq: Every day | ORAL | Status: DC
  Administered 2020-08-06 – 2020-08-10 (×4): 150 mg via ORAL
  Filled 2020-08-06 (×4): qty 2
  Filled 2020-08-06: qty 1

## 2020-08-06 MED ORDER — KETOROLAC TROMETHAMINE 15 MG/ML IJ SOLN
15.0000 mg | Freq: Four times a day (QID) | INTRAMUSCULAR | Status: DC | PRN
  Administered 2020-08-06: 15 mg via INTRAVENOUS
  Filled 2020-08-06: qty 1

## 2020-08-06 MED ORDER — ACETAMINOPHEN 325 MG PO TABS
650.0000 mg | ORAL_TABLET | Freq: Four times a day (QID) | ORAL | Status: DC | PRN
  Administered 2020-08-07 – 2020-08-08 (×3): 650 mg via ORAL
  Filled 2020-08-06 (×3): qty 2

## 2020-08-06 MED ORDER — TRAMADOL HCL 50 MG PO TABS
50.0000 mg | ORAL_TABLET | Freq: Four times a day (QID) | ORAL | Status: DC | PRN
  Administered 2020-08-08 (×2): 50 mg via ORAL
  Filled 2020-08-06 (×3): qty 1

## 2020-08-06 MED ORDER — ENSURE ENLIVE PO LIQD
237.0000 mL | Freq: Three times a day (TID) | ORAL | Status: DC
  Administered 2020-08-06 – 2020-08-10 (×9): 237 mL via ORAL
  Filled 2020-08-06: qty 237

## 2020-08-06 MED ORDER — LISINOPRIL 20 MG PO TABS
20.0000 mg | ORAL_TABLET | Freq: Every day | ORAL | Status: DC
  Administered 2020-08-06 – 2020-08-10 (×5): 20 mg via ORAL
  Filled 2020-08-06 (×3): qty 1
  Filled 2020-08-06: qty 2
  Filled 2020-08-06: qty 1

## 2020-08-06 MED ORDER — DULOXETINE HCL 60 MG PO CPEP
60.0000 mg | ORAL_CAPSULE | Freq: Every day | ORAL | Status: DC
  Administered 2020-08-06 – 2020-08-10 (×4): 60 mg via ORAL
  Filled 2020-08-06 (×3): qty 1
  Filled 2020-08-06: qty 2
  Filled 2020-08-06: qty 1

## 2020-08-06 NOTE — Progress Notes (Signed)
  Speech Language Pathology Treatment: Dysphagia  Patient Details Name: Marie Delacruz MRN: [id number] DOB: [birth year] Today's Date: 08/06/2020 Time: 1150-1207 SLP Time Calculation (min) (ACUTE ONLY): 17 min  Assessment / Plan / Recommendation Clinical Impression  Pt was seen for dyspahgia treatment. She was alert and cooperative during the session without c/o pain. However, moaning was noted throughout the session. Pt stated that she does not like the modified diet and that it tastes "awful". Pt tolerated regular texture solids and thin liquids via cup without overt s/sx of aspiration. Mastication time was mildly increased. She exhibited significant coughing with 2/4 trials of thin liquids via straw, suggesting aspiration. Pt appeared to have difficulty coordinating respiration with swallowing and inhalation was frequently noted following swallowing. Pt's diet will be advanced to dysphagia 2 diet with thin liquids via cup only. SLP will follow to assess improvement in swallow function and potential for further diet advancement.    HPI HPI: 54 year old woman with hx of COPD, depression, HTN, HLD, hypothyroidism, here after sudden loss of consciousness.  ETT 12/11-12/12.      SLP Plan  Continue with current plan of care       Recommendations  Diet recommendations: Dysphagia 2 (fine chop);Thin liquid Liquids provided via: Cup;No straw Medication Administration: Whole meds with puree Supervision: Staff to assist with self feeding;Patient able to self feed;Full supervision/cueing for compensatory strategies Compensations: Minimize environmental distractions;Small sips/bites Postural Changes and/or Swallow Maneuvers: Seated upright 90 degrees                Oral Care Recommendations: Oral care BID Follow up Recommendations:  (TBD) SLP Visit Diagnosis: Dysphagia, unspecified (R13.10) Plan: Continue with current plan of care       Sherlene Rickel I. Vear Clock, MS, CCC-SLP Acute  Rehabilitation Services Office number [phone] Pager [phone]               Scheryl Marten 08/06/2020, 12:37 PM

## 2020-08-06 NOTE — Progress Notes (Signed)
Initial Nutrition Assessment  DOCUMENTATION CODES:   Obesity unspecified  INTERVENTION:    Ensure Enlive po TID, each supplement provides 350 kcal and 20 grams of protein  NUTRITION DIAGNOSIS:   Inadequate oral intake related to dysphagia as evidenced by meal completion < 50%.  Ongoing  GOAL:   Patient will meet greater than or equal to 90% of their needs   Progressing  MONITOR:   PO intake,Supplement acceptance  REASON FOR ASSESSMENT:   Ventilator,Consult Enteral/tube feeding initiation and management  ASSESSMENT:   54 yo female admitted with hypercapnic respiratory failure after loss of consciousness at home. S/P CPR x 7-8 minutes by family. PMH includes COPD, depression, HTN, HLD, hypothyroidism.   Patient was extubated on 12/12. SLP following for dysphagia. Dysphagia 1 diet with nectar thick liquids was initiated 12/14. Patient has been eating poorly. Meal intakes documented at 0-25%. She does not like the modified consistency foods or beverages. Plans to advance to dysphagia 2 diet with thin liquids today per SLP note. Patient would benefit from a nutrition supplement to maximize oral intake.  Labs reviewed.  CBG: [id number]  Medications reviewed and include novolog, solumedrol, levophed.  Weight up to 116.8 kg today, 111.1 kg on admission. I/O +1.25 L since admission  Diet Order:   Diet Order            DIET - DYS 1 Room service appropriate? Yes; Fluid consistency: Nectar Thick  Diet effective now                 EDUCATION NEEDS:   Not appropriate for education at this time  Skin:  Skin Assessment: Reviewed RN Assessment (skin tear L arm)  Last BM:  12/15 type 6  Height:   Ht Readings from Last 1 Encounters:  08/02/20 5\' 6"  (1.676 m)    Weight:   Wt Readings from Last 1 Encounters:  08/06/20 116.8 kg    Ideal Body Weight:  59.1 kg  BMI:  Body mass index is 41.56 kg/m.  Estimated Nutritional Needs:   Kcal:   1900-2100  Protein:  110-120 gm  Fluid:  >/= 1.9 L    08/08/20, RD, LDN, CNSC Please refer to Amion for contact information.

## 2020-08-06 NOTE — Progress Notes (Signed)
Pt arrived to the unit from 101M. Patient was settled and all belongings were accounted for. Vital signs were taken and patient currently yellow. VS consistent with previous vitals and currently stable. Call bell left in reach.   Tera Helper E

## 2020-08-06 NOTE — Progress Notes (Addendum)
NAME:  Marie Delacruz, MRN:  [id number], DOB:  1966-08-19, LOS: 4 ADMISSION DATE:  08/01/2020, CONSULTATION DATE:  08/02/20 REFERRING MD: Tegeler, ED , CHIEF COMPLAINT:  Loss of consciousnesss  Brief History   54 year old woman with hx of copd, depression, HTN, HLD, hypothyroidism, here after sudden loss of consciousness. Admitted for hypercapnic respiratory failure    Past Medical History   Past Medical History:  Diagnosis Date  . Anxiety   . COPD (chronic obstructive pulmonary disease) (HCC)   . Depression   . HLD (hyperlipidemia)   . HTN (hypertension)   . Hypothyroid   . 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 Obesity   . PONV (postoperative nausea and vomiting)   . Prolapse of posterior vaginal wall      Past Surgical History:  Procedure Laterality Date  . BACK SURGERY    . CYSTOSCOPY N/A 05/08/2020   Procedure: CYSTOSCOPY;  Surgeon: Ranae Pila, MD;  Location: [facility];  Service: Gynecology;  Laterality: N/A;  . FACIAL COSMETIC SURGERY    . RECTOCELE REPAIR N/A 05/08/2020   Procedure: POSTERIOR REPAIR (RECTOCELE), MCCALLS,;  Surgeon: Ranae Pila, MD;  Location: [facility];  Service: Gynecology;  Laterality: N/A;  . SPINAL FUSION    . TUBAL LIGATION    . VAGINAL HYSTERECTOMY N/A 05/08/2020   Procedure: HYSTERECTOMY VAGINAL;  Surgeon: Ranae Pila, MD;  Location: [facility];  Service: Gynecology;  Laterality: N/A;  need bed     Significant Hospital Events   08/01/20 Admitted 08/03/20 Extuabted to bipap  Consults:  None  Procedures:  Intubation 12/11 - 12/12  Significant Diagnostic Tests:  CXR 12/10 - No acute cardiopulmonary disease  CT chest 12/11 1. No acute pulmonary embolism. 2. Hazy bilateral ground-glass airspace opacities, concerning for an atypical infectious process such as viral pneumonia or  developing pulmonary edema. 3. Trace right-sided pleural effusion.  CT Head W/O C 12/11 - No intracranial abnormality, Acute sphenoid sinusitis  Echo:  EF 50-55%, no valvular disease.  Micro Data:  Influenza neg Covid neg BC x 2 12/10 - no growth UC - no growth Respiratory Panel PCR - negative MRSA PCR Negative Sputum Cx 08/02/20 - Abundant WBC, PMN's. Moderate gram - coccobacilli and few gram + rods. Few normal respiratory flora, no staph or pseudomonas  Antimicrobials:  Zosyn 12/10>> DC 12/13 Vanc 12/10 - 12/11 Aziothro 12/10>> Ceftriaxone 12/13>>  Interim history/subjective:  Patient appears calmer this morning and is following commands. Tolerating PO dysphagia diet well. Denies being in pain or shortness of breath. Frustrated with soft restraints, explained to her that if she is able to stay calm and not pull of her oxygen or cause harm to herself, we can remove them. She agrees to the plan, but notes she would not like to take the seroquel.   Objective   Blood pressure (!) 177/99, pulse (!) 59, temperature 98.1 F (36.7 C), temperature source Oral, resp. rate 20, height 5\' 6"  (1.676 m), weight 116.8 kg, SpO2 94 %.        Intake/Output Summary (Last 24 hours) at 08/06/2020 0849 Last data filed at 08/06/2020 0600 Gross per 24 hour  Intake 382.31 ml  Output --  Net 382.31 ml   Filed Weights   08/04/20 0309 08/05/20 0341 08/06/20 0339  Weight: 111.4 kg 112.6 kg 116.8 kg   Examination:  General: Nasal canula in place HENT: Moist mucous membranes Lungs: diminished breath sounds, no wheezing or rhonchi  Cardiovascular: tachycardic, no murmurs, gallops, rubs appreciated Abdomen: soft, BS + Extremities: no edema no erythema Neuro: moving all extremities well, restraints removed for patient to eat breakfast   Assessment & Plan:  Hypoxemic, hypercarbic respiratory failure:  In setting of suspected COPD exacerbation and possible pneumonia with bilateral hazy opacities  on CT scan. Pulmonary examination improving. Saturating well with nasal canula.  Plan: -patient extubated 08/03/2020 -patient transitioned to dysphagia diet yesterday, tolerating well.  -continue nebulizer treatments brovana and yupelir with as needed duonebs. Received dose this morning for wheezing. -d/cing solumedrol at this time -continue azithromycin (day 4) and ceftriaxone (day 3). DC vanc coverage, MRSA PCR negative.  -CBC improving  Agitation Suspect secondary to hospital delirium. Patient redirectable during interactions and improves with family at bedside. Improved overall today. Plan: -continue PRN haldol. Patient refusing seroquel. Wean precedex as tolerable. -QTC not prolonged.   Loss of consciousness:  Suspect secondary to respiratory failure as described above. Per chart review, husband noted patient had coughing spell prior to episode, possible vasovagal event. Negative echo and head CT, low suspicion for cardiac or neurological etiologies. Per history, does not seem as though patient fell, hit her head, or had any trauma. Plan: -echo with 50-55% EF. No valvular disease. -CT head w/o contrast negative for ischemic disease.  Hx of Hypothyroidism Patient on home levothyroxine. TSH of 9.  -continue levothyroxine 75 mcg daily  Hx of HLD Continue home atorvastatin 40 mg daily  Best practice (evaluated daily)   Diet: TF  Pain/Anxiety/Delirium protocol (if indicated): PRN Haldol. Precedex VAP protocol (if indicated): N/A DVT prophylaxis: lovenox/SCD GI prophylaxis: protonix Glucose control: SSI Mobility: Bedrest last date of multidisciplinary goals of care discussion: N/A Family and staff present: Nurse at bedside Summary of discussion: N/A Follow up goals of care discussion due 08/05/20 Code Status: Full Code  Disposition: ICU, if able to stop precedex, will transition off of ICU  Labs   CBC: Recent Labs  Lab 08/01/20 2350 08/02/20 0057 08/02/20 1001  08/02/20 1053 08/03/20 0123 08/03/20 0349 08/04/20 0955 08/05/20 0053 08/06/20 0011  WBC 21.4*  --  24.4*  --  32.9*  --  28.8* 23.0* 16.0*  NEUTROABS 9.8*  --   --   --   --   --   --   --   --   HGB 13.9   < > 13.1   < > 12.8 12.9 12.3 12.5 12.3  HCT 44.3   < > 42.5   < > 39.8 38.0 38.4 36.7 36.4  MCV 100.0  --  100.7*  --  96.6  --  96.2 94.6 93.6  PLT 380  --  319  --  288  --  323 350 324   < > = values in this interval not displayed.    Basic Metabolic Panel: Recent Labs  Lab 08/01/20 2350 08/02/20 0057 08/02/20 1001 08/02/20 1053 08/02/20 1411 08/03/20 0123 08/03/20 0349 08/05/20 0053 08/06/20 0011  NA 135   < > 140   < > 137 136 136 140 142  K 3.4*   < > 4.0   < > 4.2 3.6 3.5 3.7 3.6  CL 98  --  103  --   --  100  --  105 105  CO2 27  --  22  --   --  24  --  26 25  GLUCOSE 292*  --  153*  --   --  219*  --  136* 93  BUN 15  --  19  --   --  23*  --  42* 36*  CREATININE 1.09*  --  1.33*  --   --  1.30*  --  0.92 0.89  CALCIUM 9.8  --  9.0  --   --  9.1  --  9.5 9.5  MG  --   --  1.8  --   --   --   --   --   --    < > = values in this interval not displayed.   GFR: Estimated Creatinine Clearance: 92.8 mL/min (by C-G formula based on SCr of 0.89 mg/dL). Recent Labs  Lab 08/01/20 2350 08/02/20 0151 08/02/20 1001 08/03/20 0123 08/04/20 0230 08/04/20 0955 08/05/20 0053 08/06/20 0011  PROCALCITON  --   --  6.64 7.22 2.36  --   --   --   WBC 21.4*  --  24.4* 32.9*  --  28.8* 23.0* 16.0*  LATICACIDVEN 3.1* 1.6  --   --   --   --   --   --     Liver Function Tests: Recent Labs  Lab 08/01/20 2350 08/02/20 1001 08/03/20 0123 08/05/20 0053 08/06/20 0011  AST 27 25 41 42* 38  ALT 26 25 28  37 41  ALKPHOS 119 92 74 75 66  BILITOT 0.7 0.8 0.5 0.5 1.0  PROT 7.5 6.5 6.4* 6.2* 6.2*  ALBUMIN 4.3 3.8 3.4* 3.3* 3.4*   Recent Labs  Lab 08/01/20 2350  LIPASE 85*   Recent Labs  Lab 08/02/20 0159  AMMONIA 57*    ABG    Component Value Date/Time    PHART 7.287 (L) 08/03/2020 0349   PCO2ART 59.0 (H) 08/03/2020 0349   PO2ART 63 (L) 08/03/2020 0349   HCO3 28.0 08/03/2020 0349   TCO2 30 08/03/2020 0349   ACIDBASEDEF 2.0 08/02/2020 1411   O2SAT 87.0 08/03/2020 0349     Coagulation Profile: Recent Labs  Lab 08/01/20 2350  INR 1.0    Cardiac Enzymes: No results for input(s): CKTOTAL, CKMB, CKMBINDEX, TROPONINI in the last 168 hours.  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   Surgical History    Past Surgical History:  Procedure Laterality Date  . BACK SURGERY    . CYSTOSCOPY N/A 05/08/2020   Procedure: CYSTOSCOPY;  Surgeon: 05/10/2020, MD;  Location: [facility];  Service: Gynecology;  Laterality: N/A;  . FACIAL COSMETIC SURGERY    . RECTOCELE REPAIR N/A 05/08/2020   Procedure: POSTERIOR REPAIR (RECTOCELE), MCCALLS,;  Surgeon: 05/10/2020, MD;  Location: [facility];  Service: Gynecology;  Laterality: N/A;  . SPINAL FUSION    . TUBAL LIGATION    . VAGINAL HYSTERECTOMY N/A 05/08/2020   Procedure: HYSTERECTOMY VAGINAL;  Surgeon: Ranae PilaLeger, Elise Jennifer, MD;  [facility];  Service: Gynecology;  Laterality: N/A;  need bed     Social History   reports that she quit smoking about 7  months ago. She has never used smokeless tobacco. She reports that she does not drink alcohol and does not use drugs.   Family History   Her family history is not on file.   Allergies No Known Allergies   Home Medications  Prior to Admission medications   Medication Sig Start Date End Date Taking? Authorizing Provider  ANORO ELLIPTA 62.5-25 MCG/INH AEPB Inhale 1 puff into the lungs daily. 04/03/20  Yes [provider]  atorvastatin (LIPITOR) 40 MG tablet Take 40 mg by mouth at bedtime. 03/24/20  Yes [provider]  Calcium Carb-Cholecalciferol 500-125 MG-UNIT TABS Take 1 tablet by mouth daily.   Yes [provider]  Cholecalciferol (VITAMIN D) 50 MCG (2000 UT) CAPS Take 2,000 Units by mouth daily.   Yes [provider]  desvenlafaxine (PRISTIQ) 100 MG 24 hr tablet Take 100 mg by mouth daily.   Yes [provider]  docusate sodium (COLACE) 100 MG capsule Take 1 capsule (100 mg total) by mouth 2 (two) times daily. 05/09/20  Yes Ranae PilaLeger, Elise Jennifer, MD  DULoxetine (CYMBALTA) 60 MG capsule Take 60 mg by mouth daily. 04/03/20  Yes [provider]  fluticasone (FLONASE) 50 MCG/ACT nasal spray Place 1 spray into both nostrils daily as needed for allergies or rhinitis.   Yes [provider]  ibuprofen (ADVIL) 600 MG tablet Take 1 tablet (600 mg total) by mouth every 6 (six) hours as needed. 05/09/20  Yes Ranae PilaLeger, Elise Jennifer, MD  levothyroxine (SYNTHROID) 75 MCG tablet Take 75 mcg by mouth daily before breakfast. 03/24/20  Yes [provider]  lisinopril (ZESTRIL) 20 MG tablet Take 20 mg by mouth daily. 01/07/20  Yes [provider]  Multiple Vitamin (MULTIVITAMIN WITH MINERALS) TABS tablet Take 1 tablet by mouth daily.   Yes [provider]  nicotine (NICODERM CQ - DOSED IN MG/24 HOURS) 21 mg/24hr patch Place 21 mg onto the skin daily.   Yes [provider]  traMADol (ULTRAM) 50 MG tablet Take 1 tablet (50  mg total) by mouth every 4 (four) hours as needed for moderate pain. 05/09/20  Yes Ranae PilaLeger, Elise Jennifer, MD  triamcinolone cream (KENALOG) 0.1 % Apply 1 application topically daily as needed (psoriasis).  01/03/20  Yes [provider]     Thalia BloodgoodVasili Lei Dower DO Internal Medicine Resident PGY-1 [facility]

## 2020-08-06 NOTE — Progress Notes (Signed)
Patient transferred to 2W room 34, report given to RN prior to transfer.

## 2020-08-07 DIAGNOSIS — I10 Essential (primary) hypertension: Secondary | ICD-10-CM

## 2020-08-07 DIAGNOSIS — E785 Hyperlipidemia, unspecified: Secondary | ICD-10-CM

## 2020-08-07 DIAGNOSIS — D72829 Elevated white blood cell count, unspecified: Secondary | ICD-10-CM

## 2020-08-07 DIAGNOSIS — E039 Hypothyroidism, unspecified: Secondary | ICD-10-CM

## 2020-08-07 LAB — COMPREHENSIVE METABOLIC PANEL
ALT: 42 U/L (ref 0–44)
AST: 29 U/L (ref 15–41)
Albumin: 3.5 g/dL (ref 3.5–5.0)
Alkaline Phosphatase: 73 U/L (ref 38–126)
Anion gap: 12 (ref 5–15)
BUN: 18 mg/dL (ref 6–20)
CO2: 28 mmol/L (ref 22–32)
Calcium: 9.6 mg/dL (ref 8.9–10.3)
Chloride: 100 mmol/L (ref 98–111)
Creatinine, Ser: 0.75 mg/dL (ref 0.44–1.00)
GFR, Estimated: >60 mL/min (ref >60)
Glucose, Bld: 96 mg/dL (ref 70–99)
Potassium: 3 mmol/L — ABNORMAL LOW (ref 3.5–5.1)
Sodium: 140 mmol/L (ref 135–145)
Total Bilirubin: 1.1 mg/dL (ref 0.3–1.2)
Total Protein: 6.2 g/dL — ABNORMAL LOW (ref 6.5–8.1)

## 2020-08-07 LAB — CBC WITH DIFFERENTIAL/PLATELET
Abs Immature Granulocytes: 0.73 10*3/uL — ABNORMAL HIGH (ref 0.00–0.07)
Basophils Absolute: 0.2 10*3/uL — ABNORMAL HIGH (ref 0.0–0.1)
Basophils Relative: 1 %
Eosinophils Absolute: 0.2 10*3/uL (ref 0.0–0.5)
Eosinophils Relative: 1 %
HCT: 43.8 % (ref 36.0–46.0)
Hemoglobin: 14.4 g/dL (ref 12.0–15.0)
Immature Granulocytes: 4 %
Lymphocytes Relative: 22 %
Lymphs Abs: 3.8 10*3/uL (ref 0.7–4.0)
MCH: 30.6 pg (ref 26.0–34.0)
MCHC: 32.9 g/dL (ref 30.0–36.0)
MCV: 93.2 fL (ref 80.0–100.0)
Monocytes Absolute: 1.4 10*3/uL — ABNORMAL HIGH (ref 0.1–1.0)
Monocytes Relative: 8 %
Neutro Abs: 11.5 10*3/uL — ABNORMAL HIGH (ref 1.7–7.7)
Neutrophils Relative %: 64 %
Platelets: 381 10*3/uL (ref 150–400)
RBC: 4.7 MIL/uL (ref 3.87–5.11)
RDW: 13.7 % (ref 11.5–15.5)
WBC: 17.8 10*3/uL — ABNORMAL HIGH (ref 4.0–10.5)
nRBC: 0.2 % (ref 0.0–0.2)

## 2020-08-07 LAB — MAGNESIUM: Magnesium: 2 mg/dL (ref 1.7–2.4)

## 2020-08-07 LAB — BLOOD GAS, ARTERIAL
Acid-Base Excess: 5.2 mmol/L — ABNORMAL HIGH (ref 0.0–2.0)
Bicarbonate: 29.2 mmol/L — ABNORMAL HIGH (ref 20.0–28.0)
Drawn by: 53309
FIO2: 32
O2 Saturation: 95.9 %
Patient temperature: 37.1
pCO2 arterial: 43.6 mmHg (ref 32.0–48.0)
pH, Arterial: 7.441 (ref 7.350–7.450)
pO2, Arterial: 78 mmHg — ABNORMAL LOW (ref 83.0–108.0)

## 2020-08-07 LAB — GLUCOSE, CAPILLARY
Glucose-Capillary: 98 mg/dL (ref 70–99)
Glucose-Capillary: 105 mg/dL — ABNORMAL HIGH (ref 70–99)
Glucose-Capillary: 95 mg/dL (ref 70–99)
Glucose-Capillary: 105 mg/dL — ABNORMAL HIGH (ref 70–99)
Glucose-Capillary: 102 mg/dL — ABNORMAL HIGH (ref 70–99)
Glucose-Capillary: 106 mg/dL — ABNORMAL HIGH (ref 70–99)
Glucose-Capillary: 124 mg/dL — ABNORMAL HIGH (ref 70–99)
Glucose-Capillary: 102 mg/dL — ABNORMAL HIGH (ref 70–99)

## 2020-08-07 LAB — PHOSPHORUS: Phosphorus: 3.2 mg/dL (ref 2.5–4.6)

## 2020-08-07 MED ORDER — GUAIFENESIN ER 600 MG PO TB12
1200.0000 mg | ORAL_TABLET | Freq: Two times a day (BID) | ORAL | Status: DC
  Administered 2020-08-07 – 2020-08-10 (×6): 1200 mg via ORAL
  Filled 2020-08-07 (×6): qty 2

## 2020-08-07 MED ORDER — POTASSIUM CHLORIDE CRYS ER 20 MEQ PO TBCR
40.0000 meq | EXTENDED_RELEASE_TABLET | Freq: Two times a day (BID) | ORAL | Status: AC
  Administered 2020-08-07: 40 meq via ORAL
  Filled 2020-08-07: qty 2

## 2020-08-07 NOTE — Progress Notes (Signed)
PROGRESS NOTE    Marie Delacruz  ZOX:[id number] DOB: 1965/08/28 DOA: 08/01/2020 PCP: [facility]   Brief Narrative:  The patient is a 54 year old morbidly obese Caucasian female with a past medical history significant for but not limited to history of COPD, depression anxiety, hypertension, hyperlipidemia, hypothyroidism as well as other comorbidities who presented with a sudden loss of consciousness.  She is admitted for hypercapnic hypoxic respiratory failure and was intubated for airway protection and extubated on 12/03/2019.  During her hospital stay she became extremely agitated and had to be placed on a Precedex drip which is weaned off.  During her hospital stay she was also placed on Levophed which has now been weaned off.  She was transferred to the floor on 08/06/2020 and transferred to [facility] service 08/07/2020. This morning she was a little bit somnolent so ABG was ordered.  Continue to work on her pulmonary toileting and obtain PT and OT for further evaluation  Assessment & Plan:   Active Problems:   Acute respiratory failure with hypoxia and hypercapnia (HCC)   Delirium   Toxic metabolic encephalopathy   Community acquired pneumonia  Acute Respiratory Failure with Hypoxia and Hypercarbia in the setting of Acute COPD Exacerbation with superimposed PNA  -In setting of suspected COPD exacerbation and possible pneumonia with bilateral hazy opacities on CT scan.  -Respiratory Virus Panel by PCR was Negative -Had an Elevated D-Dimer of 2.54 so CTA was obtained and showed "No acute pulmonary embolism. Hazy bilateral ground-glass airspace opacities, concerning for an atypical infectious process such as viral pneumonia or developing pulmonary edema. Trace right-sided pleural effusion." -Patient extubated 08/03/2020 -SLP following and patient transitioned to dysphagia diet; Now on  -Continue nebulizer treatments Arfomoterol 15 mcg Neb BIDand Revefenacin 175 mcg Neb along  with DuoNeb 3 mL q4hprn Wheezing and SOB -Will add Flutter Valve, Incentive Spirometry, and Guaifenesin 1200 mg po BID  -IV Steroids discontinued by Pulmonary -Abx now have been disontinued -SpO2: 97 % O2 Flow Rate (L/min): 3 L/min FiO2 (%): 40 % -Continue Supplemental O2 via [place] and wean as Tolerated -Continuous Pulse Oximetry and Maintain O2 Saturations >90% -She was a little somnolent this AM so ABG was obtained;     Component Value Date/Time   PHART 7.441 08/07/2020 1101   PCO2ART 43.6 08/07/2020 1101   PO2ART 78.0 (L) 08/07/2020 1101   HCO3 29.2 (H) 08/07/2020 1101   TCO2 30 08/03/2020 0349   ACIDBASEDEF 2.0 08/02/2020 1411   O2SAT 95.9 08/07/2020 1101  -She will need an ambulatory home O2 screen prior to discharge and repeat chest x-ray  Agitation, improved -Suspected secondary to hospital delirium. Patient redirectable during interactions and improves with family at bedside. Improved overall today. -Continue PRN Haldol 2 mg q3hprn. Patient refusing seroquel. Now off of Precedex -QTC not prolonged.  -Will place patient on Delirium Precautions  Loss of Consciousness -Suspect secondary to respiratory failure as described above.  -Per chart review, husband noted patient had coughing spell prior to episode, possible vasovagal event. -Echo with 50-55% EF. No valvular disease. and head CT, low suspicion for cardiac or neurological etiologies. Per history, does not seem as though patient fell, hit her head, or had any trauma. -CT head w/o contrast negative for ischemic disease. -Continue to Monitor carefully   Hypothyroidism -On presentation she had a TSH of 9.203 and a Free T4 of 0.66  -Continue levothyroxine 75 mcg daily -Repeat Thyroid Function Tests in 4-6 weeks after Discharge  Hyperlipidiema -Continue Atorvastatin 40  mg daily  Hypokalemia  -Patient's K+ this AM was 3.0 -Mag Level was 2.0 -Replete with po KCl 40 mEQ BID x2 Doses -Continue to Monitor and Replete as  Necessary -Repeat CMP in the AM  Depression and Anxiety  -C/w Duloxetine 60 mg po Daily  Hypertension -Continue Lisinopril 20 mg po Daily -C/w Hydralazine 10 mg IV q6hprn HBP for SBP >180 -Continue to Monitor BP per Protocol -Last BP was 151/85  Leukocytosis -? Related to Infection on admission but now could be elevated 2/2 to Steroid Demargination -WBC went from 21.4 -> 32.9 -> 23.0 -> 16.0 -> 17.8 -Completed Abx -LA on Admissionw as 1.6 -PCT went from 7.22 -> 2.36 -Continue to Monitor for S/Sx of Infection -Repeat CBC in the AM   Elevated AST -Improved. AST went from 42 -> 38 -> 29 -Continue to Monitor intermittently and will only get a BMP in the AM  Morbid Obesity -Complicates overall prognosis and care -Estimated body mass index is 40.9 kg/m as calculated from the following:   Height as of this encounter: 5\' 6"  (1.676 m).   Weight as of this encounter: 114.9 kg.  -Weight Loss and Dietary Counseling given   GERD/GI Prophylaxis -C/w Pantoprazole 40 mg po Daily   DVT prophylaxis: Enoxaparin 40 mg sq q24h Code Status: FULL CODE Family Communication: No family present at bedside  Disposition Plan: We will need pulmonary improvement and evaluation by PT and OT  Status is: Inpatient  Remains inpatient appropriate because:Unsafe d/c plan, IV treatments appropriate due to intensity of illness or inability to take PO and Inpatient level of care appropriate due to severity of illness   Dispo: The patient is from: Home              Anticipated d/c is to: TBD              Anticipated d/c date is: 2 days              Patient currently is not medically stable to d/c.  Consultants:   PCCM Transfer  Procedures:  Intubation 12/11 - 12/12  Significant Diagnostic Tests:  CXR 12/10 - No acute cardiopulmonary disease  CT chest 12/11 1. No acute pulmonary embolism. 2. Hazy bilateral ground-glass airspace opacities, concerning for an atypical infectious process such as  viral pneumonia or developing pulmonary edema. 3. Trace right-sided pleural effusion.  CT Head W/O C 12/11 - No intracranial abnormality, Acute sphenoid sinusitis  ECHOCardiogram: IMPRESSIONS    1. Left ventricular ejection fraction, by estimation, is 50 to 55%. The  left ventricle has low normal function. The left ventricle has no regional  wall motion abnormalities. Left ventricular diastolic parameters were  normal. Estimated Cardiac Output by  LVOT VTI is 5.3 L/min.  2. Right ventricular systolic function is normal. The right ventricular  size is normal.  3. The mitral valve is normal in structure. No evidence of mitral valve  regurgitation.  4. The aortic valve was not well visualized. Aortic valve regurgitation  is not visualized.  5. The inferior vena cava is dilated in size with <50% respiratory  variability, suggesting right atrial pressure of 15 mmHg; patient  presently intubated.   Comparison(s): No prior Echocardiogram.   FINDINGS  Left Ventricle: Estimated Cardiac Output by LVOT VTI is 5.3 L/min. Left  ventricular ejection fraction, by estimation, is 50 to 55%. The left  ventricle has low normal function. The left ventricle has no regional wall  motion abnormalities. Definity  contrast agent  was given IV to delineate the left ventricular endocardial  borders. The left ventricular internal cavity size was normal in size.  There is no left ventricular hypertrophy. Left ventricular diastolic  parameters were normal.   Right Ventricle: The right ventricular size is normal. Right vetricular  wall thickness was not well visualized. Right ventricular systolic  function is normal.   Left Atrium: Left atrial size was normal in size.   Right Atrium: Right atrial size was normal in size.   Pericardium: Trivial pericardial effusion is present.   Mitral Valve: The mitral valve is normal in structure. No evidence of  mitral valve regurgitation. MV peak gradient,  2.6 mmHg. The mean mitral  valve gradient is 1.0 mmHg.   Tricuspid Valve: The tricuspid valve is grossly normal. Tricuspid valve  regurgitation is not demonstrated.   Aortic Valve: The aortic valve was not well visualized. Aortic valve  regurgitation is not visualized. Aortic valve mean gradient measures 4.0  mmHg. Aortic valve peak gradient measures 8.4 mmHg. Aortic valve area, by  VTI measures 2.38 cm.   Pulmonic Valve: The pulmonic valve was not well visualized. Pulmonic valve  regurgitation is not visualized.   Aorta: The aortic root is normal in size and structure and the ascending  aorta was not well visualized.   Venous: The pulmonary veins were not well visualized. The inferior vena  cava is dilated in size with less than 50% respiratory variability,  suggesting right atrial pressure of 15 mmHg.   IAS/Shunts: The atrial septum is grossly normal.     LEFT VENTRICLE  PLAX 2D  LVOT diam:   2.00 cm Diastology  LV SV:     64    LV e' medial:  9.03 cm/s  LV SV Index:  29    LV E/e' medial: 7.4  LVOT Area:   3.14 cm LV e' lateral:  13.10 cm/s             LV E/e' lateral: 5.1     RIGHT VENTRICLE       IVC  RV Basal diam: 2.80 cm   IVC diam: 2.40 cm  RV S prime:   11.60 cm/s  TAPSE (M-mode): 2.1 cm   LEFT ATRIUM       Index    RIGHT ATRIUM      Index  LA Vol (A2C):  56.3 ml 25.83 ml/m RA Area:   13.00 cm  LA Vol (A4C):  49.1 ml 22.53 ml/m RA Volume:  27.50 ml 12.62 ml/m  LA Biplane Vol: 54.5 ml 25.00 ml/m  AORTIC VALVE  AV Area (Vmax):  2.34 cm  AV Area (Vmean):  2.43 cm  AV Area (VTI):   2.38 cm  AV Vmax:      145.00 cm/s  AV Vmean:     95.200 cm/s  AV VTI:      0.268 m  AV Peak Grad:   8.4 mmHg  AV Mean Grad:   4.0 mmHg  LVOT Vmax:     108.00 cm/s  LVOT Vmean:    73.500 cm/s  LVOT VTI:     0.203 m  LVOT/AV VTI ratio: 0.76    AORTA  Ao Root  diam: 2.80 cm   MITRAL VALVE  MV Area (PHT): 4.06 cm  SHUNTS  MV Peak grad: 2.6 mmHg  Systemic VTI: 0.20 m  MV Mean grad: 1.0 mmHg  Systemic Diam: 2.00 cm  MV Vmax:    0.81 m/s  MV Vmean:   49.3 cm/s  MV Decel Time: 187 msec  MV E velocity: 66.80 cm/s  MV A velocity: 59.60 cm/s  MV E/A ratio: 1.12   Antimicrobials:  Anti-infectives (From admission, onward)   Start     Dose/Rate Route Frequency Ordered Stop   08/04/20 1030  cefTRIAXone (ROCEPHIN) 2 g in sodium chloride 0.9 % 100 mL IVPB        2 g 200 mL/hr over 30 Minutes Intravenous Every 24 hours 08/04/20 0941 08/06/20 1254   08/02/20 1600  vancomycin (VANCOREADY) IVPB 750 mg/150 mL  Status:  Discontinued        750 mg 150 mL/hr over 60 Minutes Intravenous Every 12 hours 08/02/20 0303 08/02/20 1749   08/02/20 1200  piperacillin-tazobactam (ZOSYN) IVPB 3.375 g  Status:  Discontinued        3.375 g 12.5 mL/hr over 240 Minutes Intravenous Every 8 hours 08/02/20 0303 08/04/20 0941   08/02/20 0330  azithromycin (ZITHROMAX) 500 mg in sodium chloride 0.9 % 250 mL IVPB        500 mg 250 mL/hr over 60 Minutes Intravenous Every 24 hours 08/02/20 0323 08/06/20 0437   08/02/20 0300  piperacillin-tazobactam (ZOSYN) IVPB 3.375 g        3.375 g 100 mL/hr over 30 Minutes Intravenous  Once 08/02/20 0252 08/02/20 0411   08/02/20 0300  vancomycin (VANCOCIN) IVPB 1000 mg/200 mL premix  Status:  Discontinued        1,000 mg 200 mL/hr over 60 Minutes Intravenous  Once 08/02/20 0252 08/02/20 0258   08/02/20 0300  vancomycin (VANCOREADY) IVPB 2000 mg/400 mL        2,000 mg 200 mL/hr over 120 Minutes Intravenous  Once 08/02/20 0258 08/02/20 0544        Subjective: And examined at bedside and she is a little bit sleepy but able to verbalize some.  States that she still a little short of breath but not as bad.  No nausea or vomiting.  Felt okay.  Denies any other concerns or complaints at this time and denies any chest  pain.  Objective: Vitals:   08/07/20 0334 08/07/20 0725 08/07/20 0730 08/07/20 0742  BP: (!) 153/72   (!) 156/77  Pulse: 86   80  Resp: 20   18  Temp: (!) 97.2 F (36.2 C)   98.9 F (37.2 C)  TempSrc: Oral   Oral  SpO2: 95% 96% 96% 100%  Weight:      Height:        Intake/Output Summary (Last 24 hours) at 08/07/2020 0834 Last data filed at 08/06/2020 2001 Gross per 24 hour  Intake --  Output 660 ml  Net -660 ml   Filed Weights   08/05/20 0341 08/06/20 0339 08/07/20 0333  Weight: 112.6 kg 116.8 kg 114.9 kg   Examination: Physical Exam:  Constitutional: WN/WD morbidly obese Caucasian female who is slightly somnolent and drowsy and in no acute distress.   Eyes:  Lids and conjunctivae normal, sclerae anicteric  ENMT: External Ears, Nose appear normal. Grossly normal hearing. Mucous membranes are moist.  Neck: Appears normal, supple, no cervical masses, normal ROM, no appreciable thyromegaly; no JVD Respiratory: Diminished to auscultation bilaterally with coarse breath sounds and mild rhonchi but no appreciable wheezing, rales, or crackles. Normal respiratory effort and patient is not tachypenic. No accessory muscle use. Unlabored breathing but wearing supplemental O2 via  Cardiovascular: RRR, no murmurs / rubs / gallops. S1 and S2 auscultated. Minimal Extremity edema Abdomen: Soft, non-tender, Distended 2/2 body habitus.  Bowel sounds positive.  GU: Deferred. Musculoskeletal: No clubbing / cyanosis of digits/nails. No joint deformity upper and lower extremities. Good ROM, no contractures. Normal strength and muscle tone.  Skin: No rashes, lesions, ulcers on a limited skin evaluation. No induration; Warm and dry.  Neurologic: CN 2-12 grossly intact with no focal deficits. Romberg sign and cerebellar reflexes not assessed.  Psychiatric: Normal judgment and insight. Slightly somnolent and drowys. Normal mood and appropriate affect.   Data Reviewed: I have personally reviewed  following labs and imaging studies  CBC: Recent Labs  Lab 08/01/20 2350 08/02/20 0057 08/02/20 1001 08/02/20 1053 08/03/20 0123 08/03/20 0349 08/04/20 0955 08/05/20 0053 08/06/20 0011  WBC 21.4*  --  24.4*  --  32.9*  --  28.8* 23.0* 16.0*  NEUTROABS 9.8*  --   --   --   --   --   --   --   --   HGB 13.9   < > 13.1   < > 12.8 12.9 12.3 12.5 12.3  HCT 44.3   < > 42.5   < > 39.8 38.0 38.4 36.7 36.4  MCV 100.0  --  100.7*  --  96.6  --  96.2 94.6 93.6  PLT 380  --  319  --  288  --  323 350 324   < > = values in this interval not displayed.   Basic Metabolic Panel: Recent Labs  Lab 08/01/20 2350 08/02/20 0057 08/02/20 1001 08/02/20 1053 08/02/20 1411 08/03/20 0123 08/03/20 0349 08/05/20 0053 08/06/20 0011  NA 135   < > 140   < > 137 136 136 140 142  K 3.4*   < > 4.0   < > 4.2 3.6 3.5 3.7 3.6  CL 98  --  103  --   --  100  --  105 105  CO2 27  --  22  --   --  24  --  26 25  GLUCOSE 292*  --  153*  --   --  219*  --  136* 93  BUN 15  --  19  --   --  23*  --  42* 36*  CREATININE 1.09*  --  1.33*  --   --  1.30*  --  0.92 0.89  CALCIUM 9.8  --  9.0  --   --  9.1  --  9.5 9.5  MG  --   --  1.8  --   --   --   --   --   --    < > = values in this interval not displayed.   GFR: Estimated Creatinine Clearance: 91.9 mL/min (by C-G formula based on SCr of 0.89 mg/dL). Liver Function Tests: Recent Labs  Lab 08/01/20 2350 08/02/20 1001 08/03/20 0123 08/05/20 0053 08/06/20 0011  AST 27 25 41 42* 38  ALT 26 25 28  37 41  ALKPHOS 119 92 74 75 66  BILITOT 0.7 0.8 0.5 0.5 1.0  PROT 7.5 6.5 6.4* 6.2* 6.2*  ALBUMIN 4.3 3.8 3.4* 3.3* 3.4*   Recent Labs  Lab 08/01/20 2350  LIPASE 85*   Recent Labs  Lab 08/02/20 0159  AMMONIA 57*   Coagulation Profile: Recent Labs  Lab 08/01/20 2350  INR 1.0   Cardiac Enzymes: No results for input(s): CKTOTAL, CKMB, CKMBINDEX, TROPONINI in the last 168 hours. BNP (last 3 results) No results for input(s): PROBNP in the last  8760 hours. HbA1C: No results for input(s): HGBA1C  in the last 72 hours. CBG: Recent Labs  Lab 08/06/20 2315 08/07/20 0138 08/07/20 0252 08/07/20 0329 08/07/20 0743  GLUCAP 108* 98 105* 95 105*   Lipid Profile: Recent Labs    08/05/20 2331  TRIG 189*   Thyroid Function Tests: No results for input(s): TSH, T4TOTAL, FREET4, T3FREE, THYROIDAB in the last 72 hours. Anemia Panel: No results for input(s): VITAMINB12, FOLATE, FERRITIN, TIBC, IRON, RETICCTPCT in the last 72 hours. Sepsis Labs: Recent Labs  Lab 08/01/20 2350 08/02/20 0151 08/02/20 1001 08/03/20 0123 08/04/20 0230  PROCALCITON  --   --  6.64 7.22 2.36  LATICACIDVEN 3.1* 1.6  --   --   --     Recent Results (from the past 240 hour(s))  Urine culture     Status: None   Collection Time: 08/01/20 12:52 AM   Specimen: Urine, Random  Result Value Ref Range Status   Specimen Description URINE, RANDOM  Final   Special Requests NONE  Final   Culture   Final    NO GROWTH Performed at [facility], [address], [place], [place] [address]    Report Status 08/03/2020 FINAL  Final  Blood culture (routine x 2)     Status: None (Preliminary result)   Collection Time: 08/01/20 11:50 PM   Specimen: BLOOD  Result Value Ref Range Status   Specimen Description BLOOD RIGHT ANTECUBITAL  Final   Special Requests   Final    BOTTLES DRAWN AEROBIC AND ANAEROBIC Blood Culture adequate volume   Culture   Final    NO GROWTH 4 DAYS Performed at [facility], [address], [place], [place] [address]    Report Status PENDING  Incomplete  Resp Panel by RT-PCR (Flu A&B, Covid)     Status: None   Collection Time: 08/01/20 11:50 PM   Specimen: Nasopharyngeal(NP) swabs in vial transport medium  Result Value Ref Range Status   SARS Coronavirus 2 by RT PCR NEGATIVE NEGATIVE Final    Comment: (NOTE) SARS-CoV-2 target nucleic acids are NOT DETECTED.  The SARS-CoV-2 RNA is generally detectable in upper  respiratory specimens during the acute phase of infection. The lowest concentration of SARS-CoV-2 viral copies this assay can detect is 138 copies/mL. A negative result does not preclude SARS-Cov-2 infection and should not be used as the sole basis for treatment or other patient management decisions. A negative result may occur with  improper specimen collection/handling, submission of specimen other than nasopharyngeal swab, presence of viral mutation(s) within the areas targeted by this assay, and inadequate number of viral copies(<138 copies/mL). A negative result must be combined with clinical observations, patient history, and epidemiological information. The expected result is Negative.  Fact Sheet for Patients:  BloggerCourse.com  Fact Sheet for Healthcare Providers:  SeriousBroker.it  This test is no t yet approved or cleared by the [place] FDA and  has been authorized for detection and/or diagnosis of SARS-CoV-2 by FDA under an Emergency Use Authorization (EUA). This EUA will remain  in effect (meaning this test can be used) for the duration of the COVID-19 declaration under Section 564(b)(1) of the Act, 21 U.S.C.section 360bbb-3(b)(1), unless the authorization is terminated  or revoked sooner.       Influenza A by PCR NEGATIVE NEGATIVE Final   Influenza B by PCR NEGATIVE NEGATIVE Final    Comment: (NOTE) The Xpert Xpress SARS-CoV-2/FLU/RSV plus assay is intended as an aid in the diagnosis of influenza from Nasopharyngeal swab specimens and should  not be used as a sole basis for treatment. Nasal washings and aspirates are unacceptable for Xpert Xpress SARS-CoV-2/FLU/RSV testing.  Fact Sheet for Patients: BloggerCourse.com  Fact Sheet for Healthcare Providers: SeriousBroker.it  This test is not yet approved or cleared by the [place] FDA and has been  authorized for detection and/or diagnosis of SARS-CoV-2 by FDA under an Emergency Use Authorization (EUA). This EUA will remain in effect (meaning this test can be used) for the duration of the COVID-19 declaration under Section 564(b)(1) of the Act, 21 U.S.C. section 360bbb-3(b)(1), unless the authorization is terminated or revoked.  Performed at [facility], [address], [place], [place] [address]   Blood culture (routine x 2)     Status: None (Preliminary result)   Collection Time: 08/02/20  1:50 AM   Specimen: BLOOD RIGHT HAND  Result Value Ref Range Status   Specimen Description BLOOD RIGHT HAND  Final   Special Requests   Final    BOTTLES DRAWN AEROBIC AND ANAEROBIC Blood Culture adequate volume   Culture   Final    NO GROWTH 4 DAYS Performed at [facility] Lab, [address], [place], [place] [address]    Report Status PENDING  Incomplete  MRSA PCR Screening     Status: None   Collection Time: 08/02/20  4:40 AM   Specimen: Nasal Mucosa; Nasopharyngeal  Result Value Ref Range Status   MRSA by PCR NEGATIVE NEGATIVE Final    Comment:        The GeneXpert MRSA Assay (FDA approved for NASAL specimens only), is one component of a comprehensive MRSA colonization surveillance program. It is not intended to diagnose MRSA infection nor to guide or monitor treatment for MRSA infections. Performed at [facility], [address], [place], [place] [address]   Culture, respiratory (non-expectorated)     Status: None   Collection Time: 08/02/20  6:12 AM   Specimen: Tracheal Aspirate; Respiratory  Result Value Ref Range Status   Specimen Description TRACHEAL ASPIRATE  Final   Special Requests NONE  Final   Gram Stain   Final    ABUNDANT WBC PRESENT, PREDOMINANTLY PMN MODERATE GRAM NEGATIVE COCCOBACILLI FEW GRAM POSITIVE COCCI    Culture   Final    FEW Normal respiratory flora-no Staph aureus or Pseudomonas seen Performed at [facility] Lab, [address] [place], [place] [address]    Report Status 08/04/2020 FINAL  Final  Respiratory Panel by PCR     Status: None   Collection Time: 08/02/20  1:01 PM   Specimen: Nasopharyngeal Swab; Respiratory  Result Value Ref Range Status   Adenovirus NOT DETECTED NOT DETECTED Final   Coronavirus 229E NOT DETECTED NOT DETECTED Final    Comment: (NOTE) The Coronavirus on the Respiratory Panel, DOES NOT test for the novel  Coronavirus (2019 nCoV)    Coronavirus HKU1 NOT DETECTED NOT DETECTED Final   Coronavirus NL63 NOT DETECTED NOT DETECTED Final   Coronavirus OC43 NOT DETECTED NOT DETECTED Final   Metapneumovirus NOT DETECTED NOT DETECTED Final   Rhinovirus / Enterovirus NOT DETECTED NOT DETECTED Final   Influenza A NOT DETECTED NOT DETECTED Final   Influenza B NOT DETECTED NOT DETECTED Final   Parainfluenza Virus 1 NOT DETECTED NOT DETECTED Final   Parainfluenza Virus 2 NOT DETECTED NOT DETECTED Final   Parainfluenza Virus 3 NOT DETECTED NOT DETECTED Final   Parainfluenza Virus 4 NOT DETECTED NOT DETECTED Final   Respiratory Syncytial  Virus NOT DETECTED NOT DETECTED Final   Bordetella pertussis NOT DETECTED NOT DETECTED Final   Bordetella Parapertussis NOT DETECTED NOT DETECTED Final   Chlamydophila pneumoniae NOT DETECTED NOT DETECTED Final   Mycoplasma pneumoniae NOT DETECTED NOT DETECTED Final    Comment: Performed at [facility], [address] [place], [place] [address]     RN Pressure Injury Documentation:     Estimated body mass index is 40.9 kg/m as calculated from the following:   Height as of this encounter: 5\' 6"  (1.676 m).   Weight as of this encounter: 114.9 kg.  Malnutrition Type:  Nutrition Problem: Inadequate oral intake Etiology: dysphagia   Malnutrition Characteristics:  Signs/Symptoms: meal completion < 50%   Nutrition Interventions:  Interventions: MVI,Ensure Enlive (each supplement provides 350kcal and 20 grams of protein)     Radiology  Studies: No results found.  Scheduled Meds: . arformoterol  15 mcg Nebulization BID  . atorvastatin  40 mg Oral QPM  . chlorhexidine  15 mL Mouth Rinse BID  . Chlorhexidine Gluconate Cloth  6 each Topical Daily  . DULoxetine  60 mg Oral Daily  . enoxaparin (LOVENOX) injection  40 mg Subcutaneous Q24H  . feeding supplement  237 mL Oral TID BM  . insulin aspart  0-15 Units Subcutaneous Q4H  . levothyroxine  75 mcg Oral Q0600  . lisinopril  20 mg Oral Daily  . mouth rinse  15 mL Mouth Rinse q12n4p  . pantoprazole  40 mg Oral Daily  . revefenacin  175 mcg Nebulization Daily  . venlafaxine XR  150 mg Oral Q breakfast   Continuous Infusions: . sodium chloride 10 mL/hr at 08/06/20 0800    LOS: 5 days   08/08/20, DO [facility] PAGER is on AMION  If 7PM-7AM, please contact night-coverage [email or web address]

## 2020-08-07 NOTE — Plan of Care (Signed)

## 2020-08-07 NOTE — Progress Notes (Addendum)
  Speech Language Pathology Treatment: Dysphagia  Patient Details Name: Marie Delacruz MRN: [id number] DOB: May 10, 1966 Today's Date: 08/07/2020 Time: 3875-6433 SLP Time Calculation (min) (ACUTE ONLY): 27 min  Assessment / Plan / Recommendation Clinical Impression  Pt was seen for dysphagia treatment with her daughter present for part of the session. She was alert and cooperative, but processing speed was slow and she exhibited moaning throughout the session. Pt reported back pain, but was unable to quantify it. Michaella, RN reported that the pt has been pocketing solids and holding pills in her mouth. No s/sx of aspiration were noted with puree solids, regular texture solids, dysphagia 2 solids, or thin liquids via cup. Mastication was prolonged with regular textures. Pt exhibited oral holding after approximately 2-3 swallows of regular textures. Pt was able to swallow some of those additional boluses when a liquid bolus was given, but suctioning was intermittently required. Pt continues to inconsistently exhibit signs of aspiration with thin liquid liquids via straw and her overall swallow function appears to have declined since she was last seen on 12/15  with new oral holding and the need for cueing to swallow. Pt's diet will not be changed at this time since she demonstrated less oral holding with boluses which required mastication. A modified barium swallow study is recommended to further assess swallow function, but it cannot be completed today due to a scheduling conflict with radiology.    HPI HPI: 54 year old woman with hx of COPD, depression, HTN, HLD, hypothyroidism, here after sudden loss of consciousness.  ETT 12/11-12/12. CT chest 12/11: Hazy bilateral ground-glass airspace opacities, concerning for an atypical infectious process such as viral pneumonia or developing  pulmonary edema.      SLP Plan  MBS       Recommendations  Diet recommendations: Dysphagia 2 (fine chop);Thin  liquid Liquids provided via: Cup;No straw Medication Administration: Whole meds with puree Supervision: Staff to assist with self feeding;Patient able to self feed;Full supervision/cueing for compensatory strategies Compensations: Minimize environmental distractions;Small sips/bites Postural Changes and/or Swallow Maneuvers: Seated upright 90 degrees                Oral Care Recommendations: Oral care BID Follow up Recommendations:  (TBD) SLP Visit Diagnosis: Dysphagia, unspecified (R13.10) Plan: MBS       Betzaida Cremeens I. Vear Clock, MS, CCC-SLP Acute Rehabilitation Services Office number [phone] Pager [phone]                Scheryl Marten 08/07/2020, 1:07 PM

## 2020-08-08 ENCOUNTER — Inpatient Hospital Stay (HOSPITAL_COMMUNITY): Payer: 59

## 2020-08-08 DIAGNOSIS — E876 Hypokalemia: Secondary | ICD-10-CM

## 2020-08-08 LAB — CULTURE, BLOOD (ROUTINE X 2)
Culture: NO GROWTH
Culture: NO GROWTH
Special Requests: ADEQUATE
Special Requests: ADEQUATE

## 2020-08-08 LAB — COMPREHENSIVE METABOLIC PANEL
ALT: 37 U/L (ref 0–44)
AST: 21 U/L (ref 15–41)
Albumin: 3.4 g/dL — ABNORMAL LOW (ref 3.5–5.0)
Alkaline Phosphatase: 76 U/L (ref 38–126)
Anion gap: 14 (ref 5–15)
BUN: 14 mg/dL (ref 6–20)
CO2: 28 mmol/L (ref 22–32)
Calcium: 9.5 mg/dL (ref 8.9–10.3)
Chloride: 97 mmol/L — ABNORMAL LOW (ref 98–111)
Creatinine, Ser: 0.72 mg/dL (ref 0.44–1.00)
GFR, Estimated: >60 mL/min (ref >60)
Glucose, Bld: 97 mg/dL (ref 70–99)
Potassium: 3 mmol/L — ABNORMAL LOW (ref 3.5–5.1)
Sodium: 139 mmol/L (ref 135–145)
Total Bilirubin: 1.3 mg/dL — ABNORMAL HIGH (ref 0.3–1.2)
Total Protein: 6.4 g/dL — ABNORMAL LOW (ref 6.5–8.1)

## 2020-08-08 LAB — CBC WITH DIFFERENTIAL/PLATELET
Abs Immature Granulocytes: 0.84 10*3/uL — ABNORMAL HIGH (ref 0.00–0.07)
Basophils Absolute: 0.1 10*3/uL (ref 0.0–0.1)
Basophils Relative: 1 %
Eosinophils Absolute: 0.2 10*3/uL (ref 0.0–0.5)
Eosinophils Relative: 1 %
HCT: 41.5 % (ref 36.0–46.0)
Hemoglobin: 14.4 g/dL (ref 12.0–15.0)
Immature Granulocytes: 4 %
Lymphocytes Relative: 19 %
Lymphs Abs: 4 10*3/uL (ref 0.7–4.0)
MCH: 31.9 pg (ref 26.0–34.0)
MCHC: 34.7 g/dL (ref 30.0–36.0)
MCV: 92 fL (ref 80.0–100.0)
Monocytes Absolute: 1.2 10*3/uL — ABNORMAL HIGH (ref 0.1–1.0)
Monocytes Relative: 6 %
Neutro Abs: 14.9 10*3/uL — ABNORMAL HIGH (ref 1.7–7.7)
Neutrophils Relative %: 69 %
Platelets: 390 10*3/uL (ref 150–400)
RBC: 4.51 MIL/uL (ref 3.87–5.11)
RDW: 14 % (ref 11.5–15.5)
WBC: 21.3 10*3/uL — ABNORMAL HIGH (ref 4.0–10.5)
nRBC: 0.1 % (ref 0.0–0.2)

## 2020-08-08 LAB — MAGNESIUM: Magnesium: 2 mg/dL (ref 1.7–2.4)

## 2020-08-08 LAB — GLUCOSE, CAPILLARY
Glucose-Capillary: 109 mg/dL — ABNORMAL HIGH (ref 70–99)
Glucose-Capillary: 117 mg/dL — ABNORMAL HIGH (ref 70–99)
Glucose-Capillary: 107 mg/dL — ABNORMAL HIGH (ref 70–99)
Glucose-Capillary: 104 mg/dL — ABNORMAL HIGH (ref 70–99)
Glucose-Capillary: 115 mg/dL — ABNORMAL HIGH (ref 70–99)

## 2020-08-08 LAB — PHOSPHORUS: Phosphorus: 2.5 mg/dL (ref 2.5–4.6)

## 2020-08-08 MED ORDER — POTASSIUM CHLORIDE CRYS ER 20 MEQ PO TBCR
40.0000 meq | EXTENDED_RELEASE_TABLET | Freq: Two times a day (BID) | ORAL | Status: AC
  Administered 2020-08-08 (×2): 40 meq via ORAL
  Filled 2020-08-08 (×2): qty 2

## 2020-08-08 NOTE — Progress Notes (Signed)
Modified Barium Swallow Progress Note  Patient Details  Name: Marie Delacruz MRN: [id number] Date of Birth: 1965/09/16  Today's Date: 08/08/2020  Modified Barium Swallow completed.  Full report located under Chart Review in the Imaging Section.  Brief recommendations include the following:  Clinical Impression  Pt's presentation during the swallow study was improved compared to that demonstrated on 12/16. She was alert and conversant on this date and joking with the SLP. Moaning was still intermittently noted, but pt denied pain. Pt exhibited mild oral holding with puree which she attributed to its flavor, but this was not observed with other consistencies and she responsive to verbal cues. Transient penetration (PAS 2) was observed with large boluses of thin liquids via straw; however, this is WNL and no instances of aspiration were observed. Transit of the barium tablet was slow at the level of the mid thoracic esophagus, but movement was facilitated with additional liquid boluses. Her overall oropharyngeal swallow function was [facility] during this study. A dysphagia 3 diet with thin liquids is recommended at this time and SLP will continue to follow.   Swallow Evaluation Recommendations       SLP Diet Recommendations: Dysphagia 3 (Mech soft) solids;Thin liquid   Liquid Administration via: Cup;Straw   Medication Administration: Whole meds with puree   Supervision: Patient able to self feed   Compensations: Small sips/bites;Minimize environmental distractions   Postural Changes: Remain semi-upright after after feeds/meals (Comment)   Oral Care Recommendations: Oral care BID      Janeene Sand I. Vear Clock, MS, CCC-SLP Acute Rehabilitation Services Office number [phone] Pager [phone]  Scheryl Marten 08/08/2020,10:44 AM

## 2020-08-08 NOTE — Progress Notes (Signed)
PROGRESS NOTE    Milderd MeagerHeather G Mccleery  ZOX:[id number] DOB: 10-01-1965 DOA: 08/01/2020 [facility]   Brief Narrative:  The patient is a 54 year old morbidly obese Caucasian female with a past medical history significant for but not limited to history of COPD, depression anxiety, hypertension, hyperlipidemia, hypothyroidism as well as other comorbidities who presented with a sudden loss of consciousness.  She is admitted for hypercapnic hypoxic respiratory failure and was intubated for airway protection and extubated on 12/03/2019.  During her hospital stay she became extremely agitated and had to be placed on a Precedex drip which is weaned off.  During her hospital stay she was also placed on Levophed which has now been weaned off.  She was transferred to the floor on 08/06/2020 and [facility]. This morning she was a little bit somnolent so ABG was ordered.  Continue to work on her pulmonary toileting and obtain PT and OT for further evaluation.  SLP has evaluated and recommended an MBS and she underwent this today and they are recommending a dysphagia 3 diet with thin liquids now.  Patient desaturated on ambulatory home O2 screen but PT and OT recommending home health.  She will need a 3 and 1 and a rolling walker.  Her WBC is slightly gone up and trending up and will need to continue monitor this.  Chest x-ray showed mild reticular nodular densities in both lung bases concerning for possible atypical inflammation  Assessment & Plan:   Active Problems:   Acute respiratory failure with hypoxia and hypercapnia (HCC)   Delirium   Toxic metabolic encephalopathy   Community acquired pneumonia  Acute Respiratory Failure with Hypoxia and Hypercarbia in the setting of Acute COPD Exacerbation with superimposed PNA, slowly improving -In setting of suspected COPD exacerbation and possible pneumonia with bilateral hazy opacities on CT scan.  -Respiratory Virus  Panel by PCR was Negative -Had an Elevated D-Dimer of 2.54 so CTA was obtained and showed "No acute pulmonary embolism. Hazy bilateral ground-glass airspace opacities, concerning for an atypical infectious process such as viral pneumonia or developing pulmonary edema. Trace right-sided pleural effusion." -Patient extubated 08/03/2020 -SLP following and patient transitioned to dysphagia diet; Now on dysphagia 3 diet with thin liquids based on MBS today -Continue nebulizer treatments Arfomoterol 15 mcg Neb BIDand Revefenacin 175 mcg Neb along with DuoNeb 3 mL q4hprn Wheezing and SOB -Will add Flutter Valve, Incentive Spirometry, and Guaifenesin 1200 mg po BID  -IV Steroids discontinued by Pulmonary -Abx now have been disontinued -SpO2: 92 % O2 Flow Rate (L/min): 2 L/min FiO2 (%): 40 % -Continue Supplemental O2 via Revloc and wean as Tolerated -Continuous Pulse Oximetry and Maintain O2 Saturations >90% -She was a little somnolent yesterday AM so ABG was obtained;     Component Value Date/Time   PHART 7.441 08/07/2020 1101   PCO2ART 43.6 08/07/2020 1101   PO2ART 78.0 (L) 08/07/2020 1101   HCO3 29.2 (H) 08/07/2020 1101   TCO2 30 08/03/2020 0349   ACIDBASEDEF 2.0 08/02/2020 1411   O2SAT 95.9 08/07/2020 1101  -She will need an ambulatory home O2 screen prior to discharge and repeat chest x-ray; -Repeat chest x-ray today showed "Mild reticulonodular densities are noted in both lung bases concerning for possible atypical inflammation." -Patient desaturated on ambulatory home O2 screen today will attempt again tomorrow -We will continue with pulmonary toileting and mobilization  Agitation, improved -Suspected secondary to hospital delirium. Patient redirectable during interactions and improves with family at bedside. Improved  overall today. -Continue PRN Haldol 2 mg q3hprn. Patient refusing seroquel. Now off of Precedex -QTC not prolonged.  -Will place patient on Delirium Precautions  Loss of  Consciousness -Suspect secondary to respiratory failure as described above.  -Per chart review, husband noted patient had coughing spell prior to episode, possible vasovagal event. -Echo with 50-55% EF. No valvular disease. and head CT, low suspicion for cardiac or neurological etiologies. Per history, does not seem as though patient fell, hit her head, or had any trauma. -CT head w/o contrast negative for ischemic disease. -Continue to Monitor carefully   Hypothyroidism -On presentation she had a TSH of 9.203 and a Free T4 of 0.66  -Continue levothyroxine 75 mcg daily -Repeat Thyroid Function Tests in 4-6 weeks after Discharge  Hyperlipidiema -Continue Atorvastatin 40 mg daily  Hypokalemia  -Patient's K+ this AM was 3.0 again -Mag Level was 2.0 -Replete with po KCl 40 mEQ BID x2 Doses -Continue to Monitor and Replete as Necessary -Repeat CMP in the AM  Depression and Anxiety  -C/w Duloxetine 60 mg po Daily  Hypertension -Continue Lisinopril 20 mg po Daily -C/w Hydralazine 10 mg IV q6hprn HBP for SBP >180 -Continue to Monitor BP per Protocol -Last BP was 151/85  Leukocytosis -? Related to Infection on admission but now could be elevated 2/2 to Steroid Demargination -WBC went from 21.4 -> 32.9 -> 23.0 -> 16.0 -> 17.8 and is continuing to trend up and is now 21.3 -Completed Abx -LA on Admissionw as 1.6 -PCT went from 7.22 -> 2.36; will need to repeat procalcitonin level as well as lactic acid level in the morning -Thankfully she has been afebrile -Continue to Monitor for S/Sx of Infection -Repeat CBC in the AM   Elevated AST -Improved. AST went from 42 -> 38 -> 29 and today it was 21 -Continue to Monitor intermittently  -Repeat CMP in a.m.  Hyperbilirubinemia -Mild with a T bili of 1.3  -Continue monitor and trend and repeat CMP in a.m.  Morbid Obesity -Complicates overall prognosis and care -Estimated body mass index is 40.9 kg/m as calculated from the  following:   Height as of this encounter: 5\' 6"  (1.676 m).   Weight as of this encounter: 114.9 kg.  -Weight Loss and Dietary Counseling given   GERD/GI Prophylaxis -C/w Pantoprazole 40 mg po Daily   DVT prophylaxis: Enoxaparin 40 mg sq q24h Code Status: FULL CODE Family Communication: No family present at bedside  Disposition Plan: We will need pulmonary improvement and evaluation by PT and OT; PT/OT recommending Home Health  Status is: Inpatient  Remains inpatient appropriate because:Unsafe d/c plan, IV treatments appropriate due to intensity of illness or inability to take PO and Inpatient level of care appropriate due to severity of illness   Dispo: The patient is from: Home              Anticipated d/c is to: TBD              Anticipated d/c date is: 2 days              Patient currently is not medically stable to d/c.  Consultants:   PCCM Transfer  Procedures:  Intubation 12/11 - 12/12  Significant Diagnostic Tests:  CXR 12/10 - No acute cardiopulmonary disease  CT chest 12/11 1. No acute pulmonary embolism. 2. Hazy bilateral ground-glass airspace opacities, concerning for an atypical infectious process such as viral pneumonia or developing pulmonary edema. 3. Trace right-sided pleural effusion.  CT  Head W/O C 12/11 - No intracranial abnormality, Acute sphenoid sinusitis  ECHOCardiogram: IMPRESSIONS    1. Left ventricular ejection fraction, by estimation, is 50 to 55%. The  left ventricle has low normal function. The left ventricle has no regional  wall motion abnormalities. Left ventricular diastolic parameters were  normal. Estimated Cardiac Output by  LVOT VTI is 5.3 L/min.  2. Right ventricular systolic function is normal. The right ventricular  size is normal.  3. The mitral valve is normal in structure. No evidence of mitral valve  regurgitation.  4. The aortic valve was not well visualized. Aortic valve regurgitation  is not visualized.   5. The inferior vena cava is dilated in size with <50% respiratory  variability, suggesting right atrial pressure of 15 mmHg; patient  presently intubated.   Comparison(s): No prior Echocardiogram.   FINDINGS  Left Ventricle: Estimated Cardiac Output by LVOT VTI is 5.3 L/min. Left  ventricular ejection fraction, by estimation, is 50 to 55%. The left  ventricle has low normal function. The left ventricle has no regional wall  motion abnormalities. Definity  contrast agent was given IV to delineate the left ventricular endocardial  borders. The left ventricular internal cavity size was normal in size.  There is no left ventricular hypertrophy. Left ventricular diastolic  parameters were normal.   Right Ventricle: The right ventricular size is normal. Right vetricular  wall thickness was not well visualized. Right ventricular systolic  function is normal.   Left Atrium: Left atrial size was normal in size.   Right Atrium: Right atrial size was normal in size.   Pericardium: Trivial pericardial effusion is present.   Mitral Valve: The mitral valve is normal in structure. No evidence of  mitral valve regurgitation. MV peak gradient, 2.6 mmHg. The mean mitral  valve gradient is 1.0 mmHg.   Tricuspid Valve: The tricuspid valve is grossly normal. Tricuspid valve  regurgitation is not demonstrated.   Aortic Valve: The aortic valve was not well visualized. Aortic valve  regurgitation is not visualized. Aortic valve mean gradient measures 4.0  mmHg. Aortic valve peak gradient measures 8.4 mmHg. Aortic valve area, by  VTI measures 2.38 cm.   Pulmonic Valve: The pulmonic valve was not well visualized. Pulmonic valve  regurgitation is not visualized.   Aorta: The aortic root is normal in size and structure and the ascending  aorta was not well visualized.   Venous: The pulmonary veins were not well visualized. The inferior vena  cava is dilated in size with less than 50%  respiratory variability,  suggesting right atrial pressure of 15 mmHg.   IAS/Shunts: The atrial septum is grossly normal.     LEFT VENTRICLE  PLAX 2D  LVOT diam:   2.00 cm Diastology  LV SV:     64    LV e' medial:  9.03 cm/s  LV SV Index:  29    LV E/e' medial: 7.4  LVOT Area:   3.14 cm LV e' lateral:  13.10 cm/s             LV E/e' lateral: 5.1     RIGHT VENTRICLE       IVC  RV Basal diam: 2.80 cm   IVC diam: 2.40 cm  RV S prime:   11.60 cm/s  TAPSE (M-mode): 2.1 cm   LEFT ATRIUM       Index    RIGHT ATRIUM      Index  LA Vol (A2C):  56.3 ml 25.83 ml/m  RA Area:   13.00 cm  LA Vol (A4C):  49.1 ml 22.53 ml/m RA Volume:  27.50 ml 12.62 ml/m  LA Biplane Vol: 54.5 ml 25.00 ml/m  AORTIC VALVE  AV Area (Vmax):  2.34 cm  AV Area (Vmean):  2.43 cm  AV Area (VTI):   2.38 cm  AV Vmax:      145.00 cm/s  AV Vmean:     95.200 cm/s  AV VTI:      0.268 m  AV Peak Grad:   8.4 mmHg  AV Mean Grad:   4.0 mmHg  LVOT Vmax:     108.00 cm/s  LVOT Vmean:    73.500 cm/s  LVOT VTI:     0.203 m  LVOT/AV VTI ratio: 0.76    AORTA  Ao Root diam: 2.80 cm   MITRAL VALVE  MV Area (PHT): 4.06 cm  SHUNTS  MV Peak grad: 2.6 mmHg  Systemic VTI: 0.20 m  MV Mean grad: 1.0 mmHg  Systemic Diam: 2.00 cm  MV Vmax:    0.81 m/s  MV Vmean:   49.3 cm/s  MV Decel Time: 187 msec  MV E velocity: 66.80 cm/s  MV A velocity: 59.60 cm/s  MV E/A ratio: 1.12   Antimicrobials:  Anti-infectives (From admission, onward)   Start     Dose/Rate Route Frequency Ordered Stop   08/04/20 1030  cefTRIAXone (ROCEPHIN) 2 g in sodium chloride 0.9 % 100 mL IVPB        2 g 200 mL/hr over 30 Minutes Intravenous Every 24 hours 08/04/20 0941 08/07/20 0909   08/02/20 1600  vancomycin (VANCOREADY) IVPB 750 mg/150 mL  Status:  Discontinued        750 mg 150 mL/hr over 60 Minutes Intravenous Every 12  hours 08/02/20 0303 08/02/20 1749   08/02/20 1200  piperacillin-tazobactam (ZOSYN) IVPB 3.375 g  Status:  Discontinued        3.375 g 12.5 mL/hr over 240 Minutes Intravenous Every 8 hours 08/02/20 0303 08/04/20 0941   08/02/20 0330  azithromycin (ZITHROMAX) 500 mg in sodium chloride 0.9 % 250 mL IVPB        500 mg 250 mL/hr over 60 Minutes Intravenous Every 24 hours 08/02/20 0323 08/06/20 0437   08/02/20 0300  piperacillin-tazobactam (ZOSYN) IVPB 3.375 g        3.375 g 100 mL/hr over 30 Minutes Intravenous  Once 08/02/20 0252 08/02/20 0411   08/02/20 0300  vancomycin (VANCOCIN) IVPB 1000 mg/200 mL premix  Status:  Discontinued        1,000 mg 200 mL/hr over 60 Minutes Intravenous  Once 08/02/20 0252 08/02/20 0258   08/02/20 0300  vancomycin (VANCOREADY) IVPB 2000 mg/400 mL        2,000 mg 200 mL/hr over 120 Minutes Intravenous  Once 08/02/20 0258 08/02/20 0544        Subjective: Seen and examined at bedside and she was much more awake today than she was yesterday.  Thinks that her respiratory status is slowly improving.  Denies any nausea or vomiting.  No abdominal pain.  No other concerns or complaints at this time and denies any chest pain.  Objective: Vitals:   08/08/20 0915 08/08/20 1222 08/08/20 1300 08/08/20 1400  BP:  135/62    Pulse:  82    Resp:  19 16 19   Temp:  98 F (36.7 C)    TempSrc:  Oral    SpO2: 96% 97% 95% 92%  Weight:  Height:        Intake/Output Summary (Last 24 hours) at 08/08/2020 1638 Last data filed at 08/08/2020 0915 Gross per 24 hour  Intake 220 ml  Output --  Net 220 ml   Filed Weights   08/05/20 0341 08/06/20 0339 08/07/20 0333  Weight: 112.6 kg 116.8 kg 114.9 kg   Examination: Physical Exam:  Constitutional: WN/WD morbidly obese Caucasian female who is more awake today and in no acute distress Eyes: Lids and conjunctivae normal, sclerae anicteric  ENMT: External Ears, Nose appear normal. Grossly normal hearing. Neck: Appears  normal, supple, no cervical masses, normal ROM, no appreciable thyromegaly; no JVD Respiratory: Diminished to auscultation bilaterally with coarse breath sounds, no wheezing, rales, rhonchi or crackles. Normal respiratory effort and patient is not tachypenic. No accessory muscle use.  Unlabored breathing but she is wearing supplemental oxygen via nasal cannula Cardiovascular: RRR, no murmurs / rubs / gallops. S1 and S2 auscultated.  + Trace extremity edema Abdomen: Soft, non-tender, distended secondary to body habitus.  Bowel sounds positive.  GU: Deferred. Musculoskeletal: No clubbing / cyanosis of digits/nails. No joint deformity upper and lower extremities.  Skin: No rashes, lesions, ulcers on limited skin evaluation. No induration; Warm and dry.  Neurologic: CN 2-12 grossly intact with no focal deficits. Romberg sign and cerebellar reflexes not assessed.  Psychiatric: Normal judgment and insight. Alert and oriented x 3. Normal mood and appropriate affect.   Data Reviewed: I have personally reviewed following labs and imaging studies  CBC: Recent Labs  Lab 08/01/20 2350 08/02/20 0057 08/04/20 0955 08/05/20 0053 08/06/20 0011 08/07/20 0905 08/08/20 0316  WBC 21.4*   < > 28.8* 23.0* 16.0* 17.8* 21.3*  NEUTROABS 9.8*  --   --   --   --  11.5* 14.9*  HGB 13.9   < > 12.3 12.5 12.3 14.4 14.4  HCT 44.3   < > 38.4 36.7 36.4 43.8 41.5  MCV 100.0   < > 96.2 94.6 93.6 93.2 92.0  PLT 380   < > 323 350 324 381 390   < > = values in this interval not displayed.   Basic Metabolic Panel: Recent Labs  Lab 08/02/20 1001 08/02/20 1053 08/03/20 0123 08/03/20 0349 08/05/20 0053 08/06/20 0011 08/07/20 0905 08/08/20 0316  NA 140   < > 136 136 140 142 140 139  K 4.0   < > 3.6 3.5 3.7 3.6 3.0* 3.0*  CL 103  --  100  --  105 105 100 97*  CO2 22  --  24  --  26 25 28 28   GLUCOSE 153*  --  219*  --  136* 93 96 97  BUN 19  --  23*  --  42* 36* 18 14  CREATININE 1.33*  --  1.30*  --  0.92 0.89  0.75 0.72  CALCIUM 9.0  --  9.1  --  9.5 9.5 9.6 9.5  MG 1.8  --   --   --   --   --  2.0 2.0  PHOS  --   --   --   --   --   --  3.2 2.5   < > = values in this interval not displayed.   GFR: Estimated Creatinine Clearance: 102.2 mL/min (by C-G formula based on SCr of 0.72 mg/dL). Liver Function Tests: Recent Labs  Lab 08/03/20 0123 08/05/20 0053 08/06/20 0011 08/07/20 0905 08/08/20 0316  AST 41 42* 38 29 21  ALT 28 37 41  42 37  ALKPHOS 74 75 66 73 76  BILITOT 0.5 0.5 1.0 1.1 1.3*  PROT 6.4* 6.2* 6.2* 6.2* 6.4*  ALBUMIN 3.4* 3.3* 3.4* 3.5 3.4*   Recent Labs  Lab 08/01/20 2350  LIPASE 85*   Recent Labs  Lab 08/02/20 0159  AMMONIA 57*   Coagulation Profile: Recent Labs  Lab 08/01/20 2350  INR 1.0   Cardiac Enzymes: No results for input(s): CKTOTAL, CKMB, CKMBINDEX, TROPONINI in the last 168 hours. BNP (last 3 results) No results for input(s): PROBNP in the last 8760 hours. HbA1C: No results for input(s): HGBA1C in the last 72 hours. CBG: Recent Labs  Lab 08/07/20 1934 08/07/20 2255 08/08/20 0316 08/08/20 0745 08/08/20 1221  GLUCAP 124* 102* 109* 117* 107*   Lipid Profile: Recent Labs    08/05/20 2331  TRIG 189*   Thyroid Function Tests: No results for input(s): TSH, T4TOTAL, FREET4, T3FREE, THYROIDAB in the last 72 hours. Anemia Panel: No results for input(s): VITAMINB12, FOLATE, FERRITIN, TIBC, IRON, RETICCTPCT in the last 72 hours. Sepsis Labs: Recent Labs  Lab 08/01/20 2350 08/02/20 0151 08/02/20 1001 08/03/20 0123 08/04/20 0230  PROCALCITON  --   --  6.64 7.22 2.36  LATICACIDVEN 3.1* 1.6  --   --   --     Recent Results (from the past 240 hour(s))  Urine culture     Status: None   Collection Time: 08/01/20 12:52 AM   Specimen: Urine, Random  Result Value Ref Range Status   Specimen Description URINE, RANDOM  Final   Special Requests NONE  Final   Culture   Final    NO GROWTH Performed at [facility], [address]  [place], [place] [address]    Report Status 08/03/2020 FINAL  Final  Blood culture (routine x 2)     Status: None   Collection Time: 08/01/20 11:50 PM   Specimen: BLOOD  Result Value Ref Range Status   Specimen Description BLOOD RIGHT ANTECUBITAL  Final   Special Requests   Final    BOTTLES DRAWN AEROBIC AND ANAEROBIC Blood Culture adequate volume   Culture   Final    NO GROWTH 6 DAYS Performed at [facility], [address], [place], [place] [address]    Report Status 08/08/2020 FINAL  Final  Resp Panel by RT-PCR (Flu A&B, Covid)     Status: None   Collection Time: 08/01/20 11:50 PM   Specimen: Nasopharyngeal(NP) swabs in vial transport medium  Result Value Ref Range Status   SARS Coronavirus 2 by RT PCR NEGATIVE NEGATIVE Final    Comment: (NOTE) SARS-CoV-2 target nucleic acids are NOT DETECTED.  The SARS-CoV-2 RNA is generally detectable in upper respiratory specimens during the acute phase of infection. The lowest concentration of SARS-CoV-2 viral copies this assay can detect is 138 copies/mL. A negative result does not preclude SARS-Cov-2 infection and should not be used as the sole basis for treatment or other patient management decisions. A negative result may occur with  improper specimen collection/handling, submission of specimen other than nasopharyngeal swab, presence of viral mutation(s) within the areas targeted by this assay, and inadequate number of viral copies(<138 copies/mL). A negative result must be combined with clinical observations, patient history, and epidemiological information. The expected result is Negative.  Fact Sheet for Patients:  BloggerCourse.com  Fact Sheet for Healthcare Providers:  SeriousBroker.it  This test is no t yet approved or cleared by the [place] and  has been authorized  for detection and/or diagnosis of SARS-CoV-2 by FDA under an Emergency Use Authorization (EUA).  This EUA will remain  in effect (meaning this test can be used) for the duration of the COVID-19 declaration under Section 564(b)(1) of the Act, 21 U.S.C.section 360bbb-3(b)(1), unless the authorization is terminated  or revoked sooner.       Influenza A by PCR NEGATIVE NEGATIVE Final   Influenza B by PCR NEGATIVE NEGATIVE Final    Comment: (NOTE) The Xpert Xpress SARS-CoV-2/FLU/RSV plus assay is intended as an aid in the diagnosis of influenza from Nasopharyngeal swab specimens and should not be used as a sole basis for treatment. Nasal washings and aspirates are unacceptable for Xpert Xpress SARS-CoV-2/FLU/RSV testing.  Fact Sheet for Patients: BloggerCourse.com  Fact Sheet for Healthcare Providers: SeriousBroker.it  This test is not yet approved or cleared by the [place] FDA and has been authorized for detection and/or diagnosis of SARS-CoV-2 by FDA under an Emergency Use Authorization (EUA). This EUA will remain in effect (meaning this test can be used) for the duration of the COVID-19 declaration under Section 564(b)(1) of the Act, 21 U.S.C. section 360bbb-3(b)(1), unless the authorization is terminated or revoked.  Performed at [facility], [address] [place], [place] [address]   Blood culture (routine x 2)     Status: None   Collection Time: 08/02/20  1:50 AM   Specimen: BLOOD RIGHT HAND  Result Value Ref Range Status   Specimen Description BLOOD RIGHT HAND  Final   Special Requests   Final    BOTTLES DRAWN AEROBIC AND ANAEROBIC Blood Culture adequate volume   Culture   Final    NO GROWTH 6 DAYS Performed at [facility], [address], [place], [place] [address]    Report Status 08/08/2020 FINAL  Final  MRSA PCR Screening     Status: None   Collection Time: 08/02/20  4:40 AM   Specimen: Nasal Mucosa; Nasopharyngeal  Result Value Ref Range Status   MRSA by PCR NEGATIVE NEGATIVE Final     Comment:        The GeneXpert MRSA Assay (FDA approved for NASAL specimens only), is one component of a comprehensive MRSA colonization surveillance program. It is not intended to diagnose MRSA infection nor to guide or monitor treatment for MRSA infections. Performed at [facility], [address], [place], [place] [address]   Culture, respiratory (non-expectorated)     Status: None   Collection Time: 08/02/20  6:12 AM   Specimen: Tracheal Aspirate; Respiratory  Result Value Ref Range Status   Specimen Description TRACHEAL ASPIRATE  Final   Special Requests NONE  Final   Gram Stain   Final    ABUNDANT WBC PRESENT, PREDOMINANTLY PMN MODERATE GRAM NEGATIVE COCCOBACILLI FEW GRAM POSITIVE COCCI    Culture   Final    FEW Normal respiratory flora-no Staph aureus or Pseudomonas seen Performed at [facility], [address], [place], [place] [address]    Report Status 08/04/2020 FINAL  Final  Respiratory Panel by PCR     Status: None   Collection Time: 08/02/20  1:01 PM   Specimen: Nasopharyngeal Swab; Respiratory  Result Value Ref Range Status   Adenovirus NOT DETECTED NOT DETECTED Final   Coronavirus 229E NOT DETECTED NOT DETECTED Final    Comment: (NOTE) The Coronavirus on the Respiratory Panel, DOES NOT test for the novel  Coronavirus (2019 nCoV)    Coronavirus HKU1 NOT DETECTED NOT  DETECTED Final   Coronavirus NL63 NOT DETECTED NOT DETECTED Final   Coronavirus OC43 NOT DETECTED NOT DETECTED Final   Metapneumovirus NOT DETECTED NOT DETECTED Final   Rhinovirus / Enterovirus NOT DETECTED NOT DETECTED Final   Influenza A NOT DETECTED NOT DETECTED Final   Influenza B NOT DETECTED NOT DETECTED Final   Parainfluenza Virus 1 NOT DETECTED NOT DETECTED Final   Parainfluenza Virus 2 NOT DETECTED NOT DETECTED Final   Parainfluenza Virus 3 NOT DETECTED NOT DETECTED Final   Parainfluenza Virus 4 NOT DETECTED NOT DETECTED Final   Respiratory Syncytial Virus NOT DETECTED  NOT DETECTED Final   Bordetella pertussis NOT DETECTED NOT DETECTED Final   Bordetella Parapertussis NOT DETECTED NOT DETECTED Final   Chlamydophila pneumoniae NOT DETECTED NOT DETECTED Final   Mycoplasma pneumoniae NOT DETECTED NOT DETECTED Final    Comment: Performed at [facility], [address] [place], [place] [address]     RN Pressure Injury Documentation:     Estimated body mass index is 40.9 kg/m as calculated from the following:   Height as of this encounter: 5\' 6"  (1.676 m).   Weight as of this encounter: 114.9 kg.  Malnutrition Type:  Nutrition Problem: Inadequate oral intake Etiology: dysphagia   Malnutrition Characteristics:  Signs/Symptoms: meal completion < 50%   Nutrition Interventions:  Interventions: MVI,Ensure Enlive (each supplement provides 350kcal and 20 grams of protein)    Radiology Studies: DG CHEST PORT 1 VIEW  Result Date: 08/08/2020 CLINICAL DATA:  Shortness of breath. EXAM: PORTABLE CHEST 1 VIEW COMPARISON:  August 02, 2020. FINDINGS: The heart size and mediastinal contours are within normal limits. Endotracheal and nasogastric tubes have been removed. No pneumothorax or pleural effusion is noted. Mild reticulonodular densities are noted in both lung bases concerning for possible atypical inflammation. The visualized skeletal structures are unremarkable. IMPRESSION: Mild reticulonodular densities are noted in both lung bases concerning for possible atypical inflammation. Electronically Signed   By: August 04, 2020 M.D.   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)   hx oxycodone + xana use x 7 years - in remission  Obesity   PONV (postoperative nausea and vomiting)   Prolapse of posterior vaginal wall  Past Surgical History: Past Surgical History: Procedure Laterality Date  BACK SURGERY    CYSTOSCOPY N/A 05/08/2020  Procedure: CYSTOSCOPY;  Surgeon: 05/10/2020, MD;  Location: [facility];  Service: Gynecology;  Laterality: N/A;  FACIAL COSMETIC SURGERY    RECTOCELE REPAIR N/A 05/08/2020  Procedure: POSTERIOR REPAIR (RECTOCELE), MCCALLS,;  Surgeon: 05/10/2020, MD;  Location: [facility];  Service: Gynecology;  Laterality: N/A;  SPINAL FUSION    TUBAL LIGATION    VAGINAL HYSTERECTOMY N/A 05/08/2020  Procedure: HYSTERECTOMY VAGINAL;  Surgeon: 05/10/2020, MD;  Location: [facility];  Service: Gynecology;  Laterality: N/A;  need bed HPI: 54 year old woman with hx of copd,  depression, HTN, HLD, hypothyroidism, here after sudden loss of consciousness.  ETT 12/11-12/12.  Subjective: at bedside with spouse present, lethargic Assessment / Plan / Recommendation CHL IP CLINICAL IMPRESSIONS 08/08/2020 Clinical Impression Pt's presentation during the swallow study was improved compared to that demonstrated on 12/16. She was alert and conversant on this date and joking with the SLP. Moaning was still intermittently noted, but pt denied pain. Pt exhibited mild oral holding with puree which she attributed to its flavor, but this was not observed with other consistencies and she responsive to verbal cues. Transient penetration (PAS 2) was observed with large boluses of thin liquids via straw; however, this is WNL and no instances of aspiration were observed. Transit of the barium  tablet was slow at the level of the mid thoracic esophagus, but movement was facilitated with additional liquid boluses. Her overall oropharyngeal swallow function was [facility] during this study. A dysphagia 3 diet with thin liquids is recommended at this time and SLP will continue to follow. SLP Visit Diagnosis Dysphagia, unspecified (R13.10) Attention and concentration deficit following -- Frontal lobe and executive function deficit following -- Impact on safety and function Mild aspiration risk   CHL IP TREATMENT RECOMMENDATION 08/08/2020 Treatment Recommendations Therapy as outlined in treatment plan below   Prognosis 08/08/2020 Prognosis for Safe Diet Advancement Good Barriers to Reach Goals Time post onset;Cognitive deficits Barriers/Prognosis Comment -- CHL IP DIET RECOMMENDATION 08/08/2020 SLP Diet Recommendations Dysphagia 3 (Mech soft) solids;Thin liquid Liquid Administration via Cup;Straw Medication Administration Whole meds with puree Compensations Small sips/bites;Minimize environmental distractions Postural Changes Remain semi-upright after after feeds/meals (Comment)   CHL IP OTHER RECOMMENDATIONS 08/08/2020 Recommended Consults -- Oral Care Recommendations Oral care BID Other Recommendations --   CHL IP FOLLOW UP RECOMMENDATIONS 08/08/2020 Follow up Recommendations None   CHL IP FREQUENCY AND DURATION 08/08/2020 Speech Therapy Frequency (ACUTE ONLY) min 2x/week Treatment Duration 2 weeks      CHL IP ORAL PHASE 08/08/2020 Oral Phase WFL Oral - Pudding Teaspoon -- Oral - Pudding Cup -- Oral - Honey Teaspoon -- Oral - Honey Cup -- Oral - Nectar Teaspoon -- Oral - Nectar Cup -- Oral - Nectar Straw -- Oral - Thin Teaspoon -- Oral - Thin Cup -- Oral - Thin Straw -- Oral - Puree -- Oral - Mech Soft -- Oral - Regular -- Oral - Multi-Consistency -- Oral - Pill -- Oral Phase - Comment --  CHL IP PHARYNGEAL PHASE 08/08/2020 Pharyngeal Phase WFL Pharyngeal- Pudding Teaspoon -- Pharyngeal -- Pharyngeal- Pudding Cup --  Pharyngeal -- Pharyngeal- Honey Teaspoon -- Pharyngeal -- Pharyngeal- Honey Cup -- Pharyngeal -- Pharyngeal- Nectar Teaspoon -- Pharyngeal -- Pharyngeal- Nectar Cup -- Pharyngeal -- Pharyngeal- Nectar Straw -- Pharyngeal -- Pharyngeal- Thin Teaspoon -- Pharyngeal -- Pharyngeal- Thin Cup WFL Pharyngeal -- Pharyngeal- Thin Straw Penetration/Aspiration during swallow Pharyngeal Material enters airway, remains ABOVE vocal cords then ejected out Pharyngeal- Puree -- Pharyngeal -- Pharyngeal- Mechanical Soft -- Pharyngeal -- Pharyngeal- Regular -- Pharyngeal -- Pharyngeal- Multi-consistency -- Pharyngeal -- Pharyngeal- Pill -- Pharyngeal -- Pharyngeal Comment --  CHL IP CERVICAL ESOPHAGEAL PHASE 08/08/2020 Cervical Esophageal Phase WFL Pudding Teaspoon -- Pudding Cup -- Honey Teaspoon -- Honey Cup -- Nectar Teaspoon -- Nectar Cup -- Nectar Straw -- Thin Teaspoon -- Thin Cup -- Thin Straw -- Puree -- Mechanical Soft -- Regular -- Multi-consistency -- Pill -- Cervical Esophageal Comment -- Shanika I. Vear Clock, MS, CCC-SLP Acute Rehabilitation Services Office number [phone] Pager [phone] Scheryl Marten 08/08/2020, 10:47 AM  Scheduled Meds:  arformoterol  15 mcg Nebulization BID   atorvastatin  40 mg Oral QPM   chlorhexidine  15 mL Mouth Rinse BID   Chlorhexidine Gluconate Cloth  6 each Topical Daily   DULoxetine  60 mg Oral Daily   enoxaparin (LOVENOX) injection  40 mg Subcutaneous Q24H   feeding supplement  237 mL Oral TID BM   guaiFENesin  1,200 mg Oral BID   insulin aspart  0-15 Units Subcutaneous Q4H   levothyroxine  75 mcg Oral Q0600   lisinopril  20 mg Oral Daily   mouth rinse  15 mL Mouth Rinse q12n4p   pantoprazole  40 mg Oral Daily   potassium chloride  40 mEq Oral BID   revefenacin  175 mcg Nebulization Daily   venlafaxine XR  150 mg Oral Q breakfast   Continuous Infusions:  sodium chloride 10 mL/hr at 08/06/20 0800    LOS: 6 days   Merlene Laughter, DO [facility] PAGER is on AMION  If 7PM-7AM, please contact night-coverage [email or web address]

## 2020-08-08 NOTE — Evaluation (Signed)
Physical Therapy Evaluation Patient Details Name: Marie Delacruz MRN: [id number] DOB: 05/01/1966 Today's Date: 08/08/2020   History of Present Illness  54 y/o F presenting to ED on 12/10 after loss of consciousness and CPR started by family for 7-8 minutes. Found to have hypercapnic respiratory failure. 12/11 intubated, extubated 12/12. PMHx: remission for oxy and xanax abuse, anxiety, COPD, depression, HLD, HTN, hypothyroidism, obesity, vaginal prolapse.  Clinical Impression  Pt shows decreased dynamic standing balance requiring use of external support. Pt had 4 LOB throughout session, with and without RW. 2L supplemental oxygen needed to maintain sats. Shows ambulation distance appropriate for community ambulation but with decreased safety and balance. Pt benefits from acute PT in order to work on standing static and dynamic balance as well as working on increased cardiovascular endurance while decreasing supplemental oxygen needs. Would benefit from home health PT at d/c in order to decrease caregiver burden and increase balance. O2 on room air on arrival: 89% SpO2 2L O2 during gait: 92-94% SpO2 2L O2 at rest: 96% SpO2    Follow Up Recommendations Home health PT;Supervision for mobility/OOB    Equipment Recommendations  Rolling walker with 5" wheels;Other (comment) (supplemental oxygen)    Recommendations for Other Services OT consult     Precautions / Restrictions        Mobility  Bed Mobility Overal bed mobility: Needs Assistance Bed Mobility: Supine to Sit     Supine to sit: Min guard          Transfers Overall transfer level: Needs assistance Equipment used: None Transfers: Sit to/from Stand Sit to Stand: Min assist         General transfer comment: min assist for trunk elevation and steadying self with dizziness on standing from bed, minguard to rise from [facility]; cues needed for safe hand placement with RW on sit to stand  Ambulation/Gait Ambulation/Gait  assistance: Min assist;Min guard Gait Distance (Feet): 340 Feet Assistive device: Rolling walker (2 wheeled)     Gait velocity interpretation: 1.31 - 2.62 ft/sec, indicative of limited community ambulator General Gait Details: 14' to bathroom 1x LOB at end of gait without AD min assist; used RW in hallway and alternated between min guard to min assist ,had 3 LOB with the walker that required min assist to recover, 2 of these during straight walking and 1 with turning, and stated it was 'just because I haven't been out of bed'  Stairs            Wheelchair Mobility    Modified Rankin (Stroke Patients Only)       Balance Overall balance assessment: Needs assistance   Sitting balance-Leahy Scale: Good Sitting balance - Comments: pt able to sit EOB to don socks without assist     Standing balance-Leahy Scale: Poor Standing balance comment: able to stand statically but requires UE support for gait                             Pertinent Vitals/Pain Pain Assessment: No/denies pain    Home Living Family/patient expects to be discharged to:: Private residence Living Arrangements: Spouse/significant other Available Help at Discharge: Family;Available 24 hours/day (husband and son) Type of Home: House Home Access: Level entry     Home Layout: One level Home Equipment: None      Prior Function Level of Independence: Independent         Comments: does cooking, cleaning, driving, no help needed  Hand Dominance        Extremity/Trunk Assessment   Upper Extremity Assessment Upper Extremity Assessment: Defer to OT evaluation    Lower Extremity Assessment Lower Extremity Assessment: Generalized weakness    Cervical / Trunk Assessment Cervical / Trunk Assessment: Normal  Communication   Communication: No difficulties  Cognition Arousal/Alertness: Awake/alert Behavior During Therapy: WFL for tasks assessed/performed Overall Cognitive Status:  Impaired/Different from baseline Area of Impairment: Orientation;Safety/judgement                 Orientation Level: Situation;Person;Time       Safety/Judgement: Decreased awareness of safety;Decreased awareness of deficits     General Comments: pt with constant moaning which she reports is normal; decreased safety/awareness of deficits, had 4 LOB and stated it was only due to not being out of bed      General Comments      Exercises     Assessment/Plan    PT Assessment Patient needs continued PT services  PT Problem List Decreased cognition;Decreased activity tolerance;Decreased balance;Decreased mobility;Decreased safety awareness;Decreased knowledge of use of DME       PT Treatment Interventions Gait training;Functional mobility training;Therapeutic activities;Therapeutic exercise;Cognitive remediation;Patient/family education;Balance training;DME instruction;Stair training    PT Goals (Current goals can be found in the Care Plan section)  Acute Rehab PT Goals Patient Stated Goal: go home PT Goal Formulation: With patient Time For Goal Achievement: 08/22/20 Potential to Achieve Goals: Good    Frequency Min 3X/week   Barriers to discharge        Co-evaluation               AM-PAC PT "6 Clicks" Mobility  Outcome Measure Help needed turning from your back to your side while in a flat bed without using bedrails?: None Help needed moving from lying on your back to sitting on the side of a flat bed without using bedrails?: None Help needed moving to and from a bed to a chair (including a wheelchair)?: A Little Help needed standing up from a chair using your arms (e.g., wheelchair or bedside chair)?: A Little Help needed to walk in hospital room?: A Little Help needed climbing 3-5 steps with a railing? : A Little 6 Click Score: 20    End of Session Equipment Utilized During Treatment: Gait belt Activity Tolerance: Patient tolerated treatment  well Patient left: in chair;with call bell/phone within reach;with chair alarm set Nurse Communication: Mobility status PT Visit Diagnosis: Other abnormalities of gait and mobility (R26.89)    Time: 1610-9604 PT Time Calculation (min) (ACUTE ONLY): 30 min   Charges:   PT Evaluation $PT Eval Moderate Complexity: 1 Mod PT Treatments $Gait Training: 8-22 mins        Jeri Cos, SPT [id number]  Jadan Hinojos 08/08/2020, 9:06 AM

## 2020-08-08 NOTE — Evaluation (Signed)
Occupational Therapy Evaluation Patient Details Name: Marie Delacruz MRN: [id number] DOB: Aug 25, 1965 Today's Date: 08/08/2020    History of Present Illness 54 y/o F presenting to ED on 12/10 after loss of consciousness and CPR started by family for 7-8 minutes. Found to have hypercapnic respiratory failure. 12/11 intubated, extubated 12/12. PMHx: remission for oxy and xanax abuse, anxiety, COPD, depression, HLD, HTN, hypothyroidism, obesity, vaginal prolapse.   Clinical Impression   PTA, pt was living with her husband and was independent. Pt currently requiring Min A for ADLs in standing, LB ADLs, and functional mobility. Pt performing grooming at sink and then functional mobility in hallway. Pt presenting with decreased balance, cognition, and activity tolerance. Pt would benefit from further acute OT to facilitate safe dc. Recommend dc to home with HHOT for further OT to optimize safety, independence with ADLs, and return to PLOF.   HR 70-80s. SpO2 92% on beginning of session. SpO2 87% on RA after mobility; pleth line poor during mobility.     Follow Up Recommendations  Home health OT;Supervision/Assistance - 24 hour (Progress to OP OT for cognitive therapy)    Equipment Recommendations  3 in 1 bedside commode    Recommendations for Other Services PT consult     Precautions / Restrictions Precautions Precautions: Fall;Other (comment) (Watch SpO2)      Mobility Bed Mobility Overal bed mobility: Needs Assistance Bed Mobility: Sit to Supine       Sit to supine: Min guard   General bed mobility comments: Min Guard A for safety    Transfers Overall transfer level: Needs assistance Equipment used: None Transfers: Sit to/from Stand Sit to Stand: Min assist;Min guard         General transfer comment: Min Guard A for safety and Min A for correction of balance    Balance Overall balance assessment: Needs assistance Sitting-balance support: No upper extremity  supported;Feet supported Sitting balance-Leahy Scale: Good     Standing balance support: Single extremity supported;During functional activity;No upper extremity supported Standing balance-Leahy Scale: Poor Standing balance comment: Able to maintain static standing but wiht posterior lean and LOB                           ADL either performed or assessed with clinical judgement   ADL Overall ADL's : Needs assistance/impaired Eating/Feeding: Set up;Sitting;Supervision/ safety   Grooming: Oral care;Wash/dry face;Min guard;Minimal assistance;Standing Grooming Details (indicate cue type and reason): Pt requiring Min Guard for safety at sink; Min A for posterior lean. Pt requiring increased time to locate grooming items and sequence task. Upper Body Bathing: Set up;Supervision/ safety;Sitting   Lower Body Bathing: Minimal assistance;Sit to/from stand   Upper Body Dressing : Supervision/safety;Set up;Sitting   Lower Body Dressing: Minimal assistance;Sit to/from stand   Toilet Transfer: Minimal assistance;Ambulation (simulated in room)           Functional mobility during ADLs: Minimal assistance General ADL Comments: Pt presenting with decreased balance, acitvity tolerance, and cognition.     Vision Baseline Vision/History: Wears glasses Wears Glasses: Reading only Patient Visual Report: No change from baseline Vision Assessment?: Yes Eye Alignment: Within Functional Limits Ocular Range of Motion: Within Functional Limits Alignment/Gaze Preference: Within Defined Limits Tracking/Visual Pursuits: Able to track stimulus in all quads without difficulty (Slight jumpy movement) Convergence: Within functional limits Visual Fields: No apparent deficits     Perception     Praxis      Pertinent Vitals/Pain Pain Assessment: No/denies  pain     Hand Dominance Right   Extremity/Trunk Assessment Upper Extremity Assessment Upper Extremity Assessment: Generalized  weakness   Lower Extremity Assessment Lower Extremity Assessment: Defer to PT evaluation   Cervical / Trunk Assessment Cervical / Trunk Assessment: Normal   Communication Communication Communication: No difficulties   Cognition Arousal/Alertness: Awake/alert Behavior During Therapy: WFL for tasks assessed/performed Overall Cognitive Status: Impaired/Different from baseline Area of Impairment: Safety/judgement;Attention;Following commands;Awareness;Problem solving                   Current Attention Level: Sustained   Following Commands: Follows one step commands with increased time;Follows multi-step commands with increased time;Follows multi-step commands inconsistently Safety/Judgement: Decreased awareness of safety;Decreased awareness of deficits Awareness: Intellectual;Emergent Problem Solving: Slow processing;Requires verbal cues General Comments: Pt presenting with slow processing and requiring increased time throughout. Pt easily distracted and with dififculty performing two tasks at ones; such as walking while answering simple word finding questions. When asking pt to name three animals that start with letter "F", pt able to name two, but needing to stop to think. Pt able to perform trail making back to her room and demonstrating ST memory to recall room number   General Comments  HR 70-80s. SpO2 87% on RA after mobility    Exercises     Shoulder Instructions      Home Living Family/patient expects to be discharged to:: Private residence Living Arrangements: Spouse/significant other Available Help at Discharge: Family;Available 24 hours/day (Husband and son) Type of Home: House Home Access: Level entry     Home Layout: One level     Bathroom Shower/Tub: Producer, television/film/video: Standard Bathroom Accessibility: Yes   Home Equipment: None          Prior Functioning/Environment Level of Independence: Independent        Comments: does  cooking, cleaning, driving, no help needed        OT Problem List: Decreased strength;Decreased range of motion;Decreased activity tolerance;Impaired balance (sitting and/or standing);Decreased knowledge of use of DME or AE;Decreased knowledge of precautions;Decreased safety awareness;Decreased cognition;Cardiopulmonary status limiting activity      OT Treatment/Interventions: Self-care/ADL training;Therapeutic exercise;Energy conservation;DME and/or AE instruction;Therapeutic activities;Patient/family education    OT Goals(Current goals can be found in the care plan section) Acute Rehab OT Goals Patient Stated Goal: go home OT Goal Formulation: With patient Time For Goal Achievement: 08/22/20 Potential to Achieve Goals: Good  OT Frequency: Min 2X/week   Barriers to D/C:            Co-evaluation              AM-PAC OT "6 Clicks" Daily Activity     Outcome Measure Help from another person eating meals?: A Little Help from another person taking care of personal grooming?: A Little Help from another person toileting, which includes using toliet, bedpan, or urinal?: A Little Help from another person bathing (including washing, rinsing, drying)?: A Little Help from another person to put on and taking off regular upper body clothing?: A Little Help from another person to put on and taking off regular lower body clothing?: A Little 6 Click Score: 18   End of Session Equipment Utilized During Treatment: Gait belt Nurse Communication: Mobility status  Activity Tolerance: Patient tolerated treatment well Patient left: in bed;with call bell/phone within reach;with bed alarm set  OT Visit Diagnosis: Unsteadiness on feet (R26.81);Other abnormalities of gait and mobility (R26.89);Muscle weakness (generalized) (M62.81)  Time: 0511-0211 OT Time Calculation (min): 30 min Charges:  OT General Charges $OT Visit: 1 Visit OT Evaluation $OT Eval Moderate Complexity: 1  Mod OT Treatments $Self Care/Home Management : 8-22 mins  Adeyemi Hamad MSOT, OTR/L Acute Rehab Pager: [phone] Office: [phone]  Theodoro Grist Laycie Schriner 08/08/2020, 3:22 PM

## 2020-08-08 NOTE — Plan of Care (Signed)

## 2020-08-09 ENCOUNTER — Inpatient Hospital Stay (HOSPITAL_COMMUNITY): Payer: 59

## 2020-08-09 DIAGNOSIS — R197 Diarrhea, unspecified: Secondary | ICD-10-CM

## 2020-08-09 LAB — CBC WITH DIFFERENTIAL/PLATELET
Abs Immature Granulocytes: 0.83 10*3/uL — ABNORMAL HIGH (ref 0.00–0.07)
Basophils Absolute: 0.1 10*3/uL (ref 0.0–0.1)
Basophils Relative: 0 %
Eosinophils Absolute: 0.6 10*3/uL — ABNORMAL HIGH (ref 0.0–0.5)
Eosinophils Relative: 3 %
HCT: 41.5 % (ref 36.0–46.0)
Hemoglobin: 13.7 g/dL (ref 12.0–15.0)
Immature Granulocytes: 4 %
Lymphocytes Relative: 25 %
Lymphs Abs: 5 10*3/uL — ABNORMAL HIGH (ref 0.7–4.0)
MCH: 31.2 pg (ref 26.0–34.0)
MCHC: 33 g/dL (ref 30.0–36.0)
MCV: 94.5 fL (ref 80.0–100.0)
Monocytes Absolute: 1.3 10*3/uL — ABNORMAL HIGH (ref 0.1–1.0)
Monocytes Relative: 6 %
Neutro Abs: 12.5 10*3/uL — ABNORMAL HIGH (ref 1.7–7.7)
Neutrophils Relative %: 62 %
Platelets: 371 10*3/uL (ref 150–400)
RBC: 4.39 MIL/uL (ref 3.87–5.11)
RDW: 14 % (ref 11.5–15.5)
WBC: 20.3 10*3/uL — ABNORMAL HIGH (ref 4.0–10.5)
nRBC: 0 % (ref 0.0–0.2)

## 2020-08-09 LAB — GLUCOSE, CAPILLARY
Glucose-Capillary: 102 mg/dL — ABNORMAL HIGH (ref 70–99)
Glucose-Capillary: 102 mg/dL — ABNORMAL HIGH (ref 70–99)
Glucose-Capillary: 111 mg/dL — ABNORMAL HIGH (ref 70–99)
Glucose-Capillary: 118 mg/dL — ABNORMAL HIGH (ref 70–99)
Glucose-Capillary: 129 mg/dL — ABNORMAL HIGH (ref 70–99)
Glucose-Capillary: 84 mg/dL (ref 70–99)
Glucose-Capillary: 93 mg/dL (ref 70–99)

## 2020-08-09 LAB — COMPREHENSIVE METABOLIC PANEL
ALT: 31 U/L (ref 0–44)
AST: 24 U/L (ref 15–41)
Albumin: 3.2 g/dL — ABNORMAL LOW (ref 3.5–5.0)
Alkaline Phosphatase: 62 U/L (ref 38–126)
Anion gap: 9 (ref 5–15)
BUN: 12 mg/dL (ref 6–20)
CO2: 29 mmol/L (ref 22–32)
Calcium: 9.4 mg/dL (ref 8.9–10.3)
Chloride: 101 mmol/L (ref 98–111)
Creatinine, Ser: 0.82 mg/dL (ref 0.44–1.00)
GFR, Estimated: >60 mL/min (ref >60)
Glucose, Bld: 87 mg/dL (ref 70–99)
Potassium: 3.4 mmol/L — ABNORMAL LOW (ref 3.5–5.1)
Sodium: 139 mmol/L (ref 135–145)
Total Bilirubin: 0.8 mg/dL (ref 0.3–1.2)
Total Protein: 5.8 g/dL — ABNORMAL LOW (ref 6.5–8.1)

## 2020-08-09 LAB — MAGNESIUM: Magnesium: 2.2 mg/dL (ref 1.7–2.4)

## 2020-08-09 LAB — PHOSPHORUS: Phosphorus: 2.2 mg/dL — ABNORMAL LOW (ref 2.5–4.6)

## 2020-08-09 MED ORDER — POTASSIUM CHLORIDE CRYS ER 20 MEQ PO TBCR
40.0000 meq | EXTENDED_RELEASE_TABLET | Freq: Once | ORAL | Status: AC
  Administered 2020-08-09: 40 meq via ORAL
  Filled 2020-08-09: qty 2

## 2020-08-09 MED ORDER — K PHOS MONO-SOD PHOS DI & MONO 155-852-130 MG PO TABS
500.0000 mg | ORAL_TABLET | Freq: Two times a day (BID) | ORAL | Status: AC
  Administered 2020-08-09 (×2): 500 mg via ORAL
  Filled 2020-08-09 (×2): qty 2

## 2020-08-09 NOTE — Progress Notes (Signed)
PROGRESS NOTE    Marie Delacruz  ZOX:[id number] DOB: [birth year] DOA: 08/01/2020 PCP: [facility]   Brief Narrative:  The patient is a 54 year old morbidly obese Caucasian female with a past medical history significant for but not limited to history of COPD, depression anxiety, hypertension, hyperlipidemia, hypothyroidism as well as other comorbidities who presented with a sudden loss of consciousness.  She is admitted for hypercapnic hypoxic respiratory failure and was intubated for airway protection and extubated on 12/03/2019.  During her hospital stay she became extremely agitated and had to be placed on a Precedex drip which is weaned off.  During her hospital stay she was also placed on Levophed which has now been weaned off.  She was transferred to the floor on 08/06/2020 and transferred to [facility] service 08/07/2020. This morning she was a little bit somnolent so ABG was ordered.  Continue to work on her pulmonary toileting and obtain PT and OT for further evaluation.  SLP has evaluated and recommended an MBS and she underwent this today and they are recommending a dysphagia 3 diet with thin liquids now.  Patient desaturated on ambulatory home O2 screen but PT and OT recommending home health.  She will need a 3 and 1 and a rolling walker.  Her WBC is slightly gone up and trending up and will need to continue monitor this.  Chest x-ray showed mild reticular nodular densities in both lung bases concerning for possible atypical inflammation  Assessment & Plan:   Active Problems:   Acute respiratory failure with hypoxia and hypercapnia (HCC)   Delirium   Toxic metabolic encephalopathy   Community acquired pneumonia  Acute Respiratory Failure with Hypoxia and Hypercarbia in the setting of Acute COPD Exacerbation with superimposed PNA, slowly improving -In setting of suspected COPD exacerbation and possible pneumonia with bilateral hazy opacities on CT scan.  -Respiratory Virus  Panel by PCR was Negative -Had an Elevated D-Dimer of 2.54 so CTA was obtained and showed "No acute pulmonary embolism. Hazy bilateral ground-glass airspace opacities, concerning for an atypical infectious process such as viral pneumonia or developing pulmonary edema. Trace right-sided pleural effusion." -Patient extubated 08/03/2020 -SLP following and patient transitioned to dysphagia diet; Now on dysphagia 3 diet with thin liquids based on MBS today -Continue nebulizer treatments Arfomoterol 15 mcg Neb BIDand Revefenacin 175 mcg Neb along with DuoNeb 3 mL q4hprn Wheezing and SOB -Will add Flutter Valve, Incentive Spirometry, and Guaifenesin 1200 mg po BID  -IV Steroids discontinued by Pulmonary -Abx now have been disontinued -SpO2: 91 % O2 Flow Rate (L/min): 2 L/min FiO2 (%): 21 % -Continue Supplemental O2 via [place] and wean as Tolerated -Continuous Pulse Oximetry and Maintain O2 Saturations >90% -She was a little somnolent yesterday AM so ABG was obtained;     Component Value Date/Time   PHART 7.441 08/07/2020 1101   PCO2ART 43.6 08/07/2020 1101   PO2ART 78.0 (L) 08/07/2020 1101   HCO3 29.2 (H) 08/07/2020 1101   TCO2 30 08/03/2020 0349   ACIDBASEDEF 2.0 08/02/2020 1411   O2SAT 95.9 08/07/2020 1101  -She will need an ambulatory home O2 screen prior to discharge and repeat chest x-ray; -Repeat chest x-ray today showed "Mild reticulonodular densities are noted in both lung bases concerning for possible atypical inflammation." -Patient desaturated again on home ambulatory screen and will attempt this again tomorrow -We will continue with pulmonary toileting and mobilization  Agitation, improved -Suspected secondary to hospital delirium. Patient redirectable during interactions and improves with family at bedside.  Improved overall today. -Continue PRN Haldol 2 mg q3hprn. Patient refusing seroquel. Now off of Precedex -QTC not prolonged.  -Will place patient on Delirium  Precautions  Loss of Consciousness -Suspect secondary to respiratory failure as described above.  -Per chart review, husband noted patient had coughing spell prior to episode, possible vasovagal event. -Echo with 50-55% EF. No valvular disease. and head CT, low suspicion for cardiac or neurological etiologies. Per history, does not seem as though patient fell, hit her head, or had any trauma. -CT head w/o contrast negative for ischemic disease. -Continue to Monitor carefully   Diarrhea -Patient has been having several loose brown liquidy bowel movements -Could explain her elevated leukocytosis as initially is thought secondary to infection or steroid demargination -Have stopped her as needed stool softeners and MiraLAX; she received them the night before -Checking GI pathogen panel and placed on enteric precautions -Recently completed antibiotic course and if still having persistent diarrhea next few days may benefit from obtaining a C. difficile sample -Continue to monitor extremely carefully  Hypothyroidism -On presentation she had a TSH of 9.203 and a Free T4 of 0.66  -Continue levothyroxine 75 mcg daily -Repeat Thyroid Function Tests in 4-6 weeks after Discharge  Hyperlipidiema -Continue Atorvastatin 40 mg daily  Hypokalemia  -Patient's K+ this AM was 3.4 and likely persistently low in the setting of diarrhea -Mag Level was 2.0 -Replete with po KCl 40 milliequivalents times once as well as p.o. K-Phos Neutral 500 mg p.o. twice daily x2 doses -Continue to Monitor and Replete as Necessary -Repeat CMP in the AM  Hypophosphatemia -Patient's phosphorus level was 2.2  -replete with p.o. K-Phos Neutral 5 mg p.o. twice daily x2 doses  -continue to monitor and trend -Repeat phosphorus level in the morning  Depression and Anxiety  -C/w Duloxetine 60 mg po Daily  Hypertension -Continue Lisinopril 20 mg po Daily -C/w Hydralazine 10 mg IV q6hprn HBP for SBP >180 -Continue to  Monitor BP per Protocol -Last BP was 151/85  Leukocytosis -? Related to Infection on admission but now could be elevated 2/2 to Steroid Demargination versus now her diarrheal episode -WBC went from 21.4 -> 32.9 -> 23.0 -> 16.0 -> 17.8 and is continuing to trend up and is now 21.3 yesterday and today it is 20.3 -Completed Abx -LA on Admissionw as 1.6 -PCT went from 7.22 -> 2.36; will need to repeat procalcitonin level as well as lactic acid level in the morning -Thankfully she has been afebrile but her white count remains consistently elevated -Continue to Monitor for S/Sx of Infection and we are checking a GI pathogen panel given her diarrhea; will hold off on her bowel regimen her stool softeners for now and discontinue these.  If still persisting to have diarrhea may benefit from checking C. difficile given that she is recently on antibiotics but she is not having any abdominal cramping or pain -Repeat CBC in the AM   Elevated AST -Improved and resolved -Continue to Monitor intermittently  -Repeat CMP in a.m.  Hyperbilirubinemia -Mild with a T bili of 1.3 and improved to 0.8 -Continue monitor and trend and repeat CMP in a.m.  Morbid Obesity -Complicates overall prognosis and care -Estimated body mass index is 40.9 kg/m as calculated from the following:   Height as of this encounter: 5\' 6"  (1.676 m).   Weight as of this encounter: 114.9 kg.  -Weight Loss and Dietary Counseling given   GERD/GI Prophylaxis -C/w Pantoprazole 40 mg po Daily but may need to  hold if she continues to have diarrhea  DVT prophylaxis: Enoxaparin 40 mg sq q24h Code Status: FULL CODE Family Communication: No family present at bedside  Disposition Plan: We will need pulmonary improvement and evaluation by PT and OT; PT/OT recommending Home Health once her diarrhea is worked up and once her respiratory status is improved  Status is: Inpatient  Remains inpatient appropriate because:Unsafe d/c plan, IV  treatments appropriate due to intensity of illness or inability to take PO and Inpatient level of care appropriate due to severity of illness   Dispo: The patient is from: Home              Anticipated d/c is to: TBD              Anticipated d/c date is:1-2 days              Patient currently is not medically stable to d/c.  Consultants:   PCCM Transfer  Procedures:  Intubation 12/11 - 12/12  Significant Diagnostic Tests:  CXR 12/10 - No acute cardiopulmonary disease  CT chest 12/11 1. No acute pulmonary embolism. 2. Hazy bilateral ground-glass airspace opacities, concerning for an atypical infectious process such as viral pneumonia or developing pulmonary edema. 3. Trace right-sided pleural effusion.  CT Head W/O C 12/11 - No intracranial abnormality, Acute sphenoid sinusitis  ECHOCardiogram: IMPRESSIONS    1. Left ventricular ejection fraction, by estimation, is 50 to 55%. The  left ventricle has low normal function. The left ventricle has no regional  wall motion abnormalities. Left ventricular diastolic parameters were  normal. Estimated Cardiac Output by  LVOT VTI is 5.3 L/min.  2. Right ventricular systolic function is normal. The right ventricular  size is normal.  3. The mitral valve is normal in structure. No evidence of mitral valve  regurgitation.  4. The aortic valve was not well visualized. Aortic valve regurgitation  is not visualized.  5. The inferior vena cava is dilated in size with <50% respiratory  variability, suggesting right atrial pressure of 15 mmHg; patient  presently intubated.   Comparison(s): No prior Echocardiogram.   FINDINGS  Left Ventricle: Estimated Cardiac Output by LVOT VTI is 5.3 L/min. Left  ventricular ejection fraction, by estimation, is 50 to 55%. The left  ventricle has low normal function. The left ventricle has no regional wall  motion abnormalities. Definity  contrast agent was given IV to delineate the left  ventricular endocardial  borders. The left ventricular internal cavity size was normal in size.  There is no left ventricular hypertrophy. Left ventricular diastolic  parameters were normal.   Right Ventricle: The right ventricular size is normal. Right vetricular  wall thickness was not well visualized. Right ventricular systolic  function is normal.   Left Atrium: Left atrial size was normal in size.   Right Atrium: Right atrial size was normal in size.   Pericardium: Trivial pericardial effusion is present.   Mitral Valve: The mitral valve is normal in structure. No evidence of  mitral valve regurgitation. MV peak gradient, 2.6 mmHg. The mean mitral  valve gradient is 1.0 mmHg.   Tricuspid Valve: The tricuspid valve is grossly normal. Tricuspid valve  regurgitation is not demonstrated.   Aortic Valve: The aortic valve was not well visualized. Aortic valve  regurgitation is not visualized. Aortic valve mean gradient measures 4.0  mmHg. Aortic valve peak gradient measures 8.4 mmHg. Aortic valve area, by  VTI measures 2.38 cm.   Pulmonic Valve: The pulmonic valve was  not well visualized. Pulmonic valve  regurgitation is not visualized.   Aorta: The aortic root is normal in size and structure and the ascending  aorta was not well visualized.   Venous: The pulmonary veins were not well visualized. The inferior vena  cava is dilated in size with less than 50% respiratory variability,  suggesting right atrial pressure of 15 mmHg.   IAS/Shunts: The atrial septum is grossly normal.     LEFT VENTRICLE  PLAX 2D  LVOT diam:   2.00 cm Diastology  LV SV:     64    LV e' medial:  9.03 cm/s  LV SV Index:  29    LV E/e' medial: 7.4  LVOT Area:   3.14 cm LV e' lateral:  13.10 cm/s             LV E/e' lateral: 5.1     RIGHT VENTRICLE       IVC  RV Basal diam: 2.80 cm   IVC diam: 2.40 cm  RV S prime:   11.60 cm/s  TAPSE (M-mode): 2.1  cm   LEFT ATRIUM       Index    RIGHT ATRIUM      Index  LA Vol (A2C):  56.3 ml 25.83 ml/m RA Area:   13.00 cm  LA Vol (A4C):  49.1 ml 22.53 ml/m RA Volume:  27.50 ml 12.62 ml/m  LA Biplane Vol: 54.5 ml 25.00 ml/m  AORTIC VALVE  AV Area (Vmax):  2.34 cm  AV Area (Vmean):  2.43 cm  AV Area (VTI):   2.38 cm  AV Vmax:      145.00 cm/s  AV Vmean:     95.200 cm/s  AV VTI:      0.268 m  AV Peak Grad:   8.4 mmHg  AV Mean Grad:   4.0 mmHg  LVOT Vmax:     108.00 cm/s  LVOT Vmean:    73.500 cm/s  LVOT VTI:     0.203 m  LVOT/AV VTI ratio: 0.76    AORTA  Ao Root diam: 2.80 cm   MITRAL VALVE  MV Area (PHT): 4.06 cm  SHUNTS  MV Peak grad: 2.6 mmHg  Systemic VTI: 0.20 m  MV Mean grad: 1.0 mmHg  Systemic Diam: 2.00 cm  MV Vmax:    0.81 m/s  MV Vmean:   49.3 cm/s  MV Decel Time: 187 msec  MV E velocity: 66.80 cm/s  MV A velocity: 59.60 cm/s  MV E/A ratio: 1.12   Antimicrobials:  Anti-infectives (From admission, onward)   Start     Dose/Rate Route Frequency Ordered Stop   08/04/20 1030  cefTRIAXone (ROCEPHIN) 2 g in sodium chloride 0.9 % 100 mL IVPB        2 g 200 mL/hr over 30 Minutes Intravenous Every 24 hours 08/04/20 0941 08/07/20 0909   08/02/20 1600  vancomycin (VANCOREADY) IVPB 750 mg/150 mL  Status:  Discontinued        750 mg 150 mL/hr over 60 Minutes Intravenous Every 12 hours 08/02/20 0303 08/02/20 1749   08/02/20 1200  piperacillin-tazobactam (ZOSYN) IVPB 3.375 g  Status:  Discontinued        3.375 g 12.5 mL/hr over 240 Minutes Intravenous Every 8 hours 08/02/20 0303 08/04/20 0941   08/02/20 0330  azithromycin (ZITHROMAX) 500 mg in sodium chloride 0.9 % 250 mL IVPB        500 mg 250 mL/hr over 60 Minutes Intravenous Every 24 hours 08/02/20  9604 08/06/20 0437   08/02/20 0300  piperacillin-tazobactam (ZOSYN) IVPB 3.375 g        3.375 g 100 mL/hr over 30 Minutes Intravenous  Once 08/02/20  0252 08/02/20 0411   08/02/20 0300  vancomycin (VANCOCIN) IVPB 1000 mg/200 mL premix  Status:  Discontinued        1,000 mg 200 mL/hr over 60 Minutes Intravenous  Once 08/02/20 0252 08/02/20 0258   08/02/20 0300  vancomycin (VANCOREADY) IVPB 2000 mg/400 mL        2,000 mg 200 mL/hr over 120 Minutes Intravenous  Once 08/02/20 0258 08/02/20 0544        Subjective: Seen and examined at bedside and her respiratory status is improving slowly daily but now she is complaining of significant amount of diarrhea.  She states that she has gone "countless times".  Nursing states that she has had 2 liquidy brown stools this morning.  Recently received stool softeners the night before.  Denies any chest pain, lightheadedness or dizziness.  No nausea or vomiting and denies any abdominal pain.  No other concerns complaints at this time.  Objective: Vitals:   08/09/20 1005 08/09/20 1051 08/09/20 1234 08/09/20 1521  BP:   118/62 (!) 121/56  Pulse:      Resp:   20 18  Temp:   98.2 F (36.8 C) 99.5 F (37.5 C)  TempSrc:   Oral Oral  SpO2: 91% 93% 91% 91%  Weight:      Height:       No intake or output data in the 24 hours ending 08/09/20 1700 Filed Weights   08/05/20 0341 08/06/20 0339 08/07/20 0333  Weight: 112.6 kg 116.8 kg 114.9 kg   Examination: Physical Exam:  Constitutional: WN/WD morbidly obese Caucasian female who is in NAD Eyes: Lids and conjunctivae normal, sclerae anicteric  ENMT: External Ears, Nose appear normal. Grossly normal hearing.  Neck: Appears normal, supple, no cervical masses, normal ROM, no appreciable thyromegaly; no JVD Respiratory: Diminished to auscultation bilaterally with coarse breath sounds and some slight wheezing and rhonchi but no crackles. Normal respiratory effort and patient is not tachypenic. No accessory muscle use.Unlabored breathing but wearing 2 Liters of Supplemental O2 via [place]   Cardiovascular: RRR, no murmurs / rubs / gallops. S1 and S2 auscultated.  No extremity edema. 2+ pedal pulses. No carotid bruits.  Abdomen: Soft, non-tender, Distended 2/2 body habitus. Bowel sounds positive and a little hyperactive.  GU: Deferred. Musculoskeletal: No clubbing / cyanosis of digits/nails. No joint deformity upper and lower extremities.  Skin: No rashes, lesions, ulcers. No induration; Warm and dry.  Neurologic: CN 2-12 grossly intact with no focal deficits. Romberg sign and cerebellar reflexes not assessed.  Psychiatric: Normal judgment and insight. Alert and oriented x 3. Normal mood and appropriate affect.   Data Reviewed: I have personally reviewed following labs and imaging studies  CBC: Recent Labs  Lab 08/05/20 0053 08/06/20 0011 08/07/20 0905 08/08/20 0316 08/09/20 0101  WBC 23.0* 16.0* 17.8* 21.3* 20.3*  NEUTROABS  --   --  11.5* 14.9* 12.5*  HGB 12.5 12.3 14.4 14.4 13.7  HCT 36.7 36.4 43.8 41.5 41.5  MCV 94.6 93.6 93.2 92.0 94.5  PLT 350 324 381 390 371   Basic Metabolic Panel: Recent Labs  Lab 08/05/20 0053 08/06/20 0011 08/07/20 0905 08/08/20 0316 08/09/20 0101  NA 140 142 140 139 139  K 3.7 3.6 3.0* 3.0* 3.4*  CL 105 105 100 97* 101  CO2 26 25 28  28  29  GLUCOSE 136* 93 96 97 87  BUN 42* 36* 18 14 12   CREATININE 0.92 0.89 0.75 0.72 0.82  CALCIUM 9.5 9.5 9.6 9.5 9.4  MG  --   --  2.0 2.0 2.2  PHOS  --   --  3.2 2.5 2.2*   GFR: Estimated Creatinine Clearance: 99.7 mL/min (by C-G formula based on SCr of 0.82 mg/dL). Liver Function Tests: Recent Labs  Lab 08/05/20 0053 08/06/20 0011 08/07/20 0905 08/08/20 0316 08/09/20 0101  AST 42* 38 29 21 24   ALT 37 41 42 37 31  ALKPHOS 75 66 73 76 62  BILITOT 0.5 1.0 1.1 1.3* 0.8  PROT 6.2* 6.2* 6.2* 6.4* 5.8*  ALBUMIN 3.3* 3.4* 3.5 3.4* 3.2*   No results for input(s): LIPASE, AMYLASE in the last 168 hours. No results for input(s): AMMONIA in the last 168 hours. Coagulation Profile: No results for input(s): INR, PROTIME in the last 168 hours. Cardiac Enzymes: No  results for input(s): CKTOTAL, CKMB, CKMBINDEX, TROPONINI in the last 168 hours. BNP (last 3 results) No results for input(s): PROBNP in the last 8760 hours. HbA1C: No results for input(s): HGBA1C in the last 72 hours. CBG: Recent Labs  Lab 08/09/20 0018 08/09/20 0337 08/09/20 0749 08/09/20 1235 08/09/20 1550  GLUCAP 84 102* 93 118* 129*   Lipid Profile: No results for input(s): CHOL, HDL, LDLCALC, TRIG, CHOLHDL, LDLDIRECT in the last 72 hours. Thyroid Function Tests: No results for input(s): TSH, T4TOTAL, FREET4, T3FREE, THYROIDAB in the last 72 hours. Anemia Panel: No results for input(s): VITAMINB12, FOLATE, FERRITIN, TIBC, IRON, RETICCTPCT in the last 72 hours. Sepsis Labs: Recent Labs  Lab 08/03/20 0123 08/04/20 0230  PROCALCITON 7.22 2.36    Recent Results (from the past 240 hour(s))  Urine culture     Status: None   Collection Time: 08/01/20 12:52 AM   Specimen: Urine, Random  Result Value Ref Range Status   Specimen Description URINE, RANDOM  Final   Special Requests NONE  Final   Culture   Final    NO GROWTH Performed at [facility], [address], [place], [address] [place]    Report Status 08/03/2020 FINAL  Final  Blood culture (routine x 2)     Status: None   Collection Time: 08/01/20 11:50 PM   Specimen: BLOOD  Result Value Ref Range Status   Specimen Description BLOOD RIGHT ANTECUBITAL  Final   Special Requests   Final    BOTTLES DRAWN AEROBIC AND ANAEROBIC Blood Culture adequate volume   Culture   Final    NO GROWTH 6 DAYS Performed at [facility], [address] [place], [address] [place]    Report Status 08/08/2020 FINAL  Final  Resp Panel by RT-PCR (Flu A&B, Covid)     Status: None   Collection Time: 08/01/20 11:50 PM   Specimen: Nasopharyngeal(NP) swabs in vial transport medium  Result Value Ref Range Status   SARS Coronavirus 2 by RT PCR NEGATIVE NEGATIVE Final    Comment: (NOTE) SARS-CoV-2 target nucleic acids are NOT  DETECTED.  The SARS-CoV-2 RNA is generally detectable in upper respiratory specimens during the acute phase of infection. The lowest concentration of SARS-CoV-2 viral copies this assay can detect is 138 copies/mL. A negative result does not preclude SARS-Cov-2 infection and should not be used as the sole basis for treatment or other patient management decisions. A negative result may occur with  improper specimen collection/handling, submission of specimen other than  nasopharyngeal swab, presence of viral mutation(s) within the areas targeted by this assay, and inadequate number of viral copies(<138 copies/mL). A negative result must be combined with clinical observations, patient history, and epidemiological information. The expected result is Negative.  Fact Sheet for Patients:  BloggerCourse.com  Fact Sheet for Healthcare Providers:  SeriousBroker.it  This test is no t yet approved or cleared by the [place] FDA and  has been authorized for detection and/or diagnosis of SARS-CoV-2 by FDA under an Emergency Use Authorization (EUA). This EUA will remain  in effect (meaning this test can be used) for the duration of the COVID-19 declaration under Section 564(b)(1) of the Act, 21 U.S.C.section 360bbb-3(b)(1), unless the authorization is terminated  or revoked sooner.       Influenza A by PCR NEGATIVE NEGATIVE Final   Influenza B by PCR NEGATIVE NEGATIVE Final    Comment: (NOTE) The Xpert Xpress SARS-CoV-2/FLU/RSV plus assay is intended as an aid in the diagnosis of influenza from Nasopharyngeal swab specimens and should not be used as a sole basis for treatment. Nasal washings and aspirates are unacceptable for Xpert Xpress SARS-CoV-2/FLU/RSV testing.  Fact Sheet for Patients: BloggerCourse.com  Fact Sheet for Healthcare Providers: SeriousBroker.it  This test is not yet  approved or cleared by the [place] FDA and has been authorized for detection and/or diagnosis of SARS-CoV-2 by FDA under an Emergency Use Authorization (EUA). This EUA will remain in effect (meaning this test can be used) for the duration of the COVID-19 declaration under Section 564(b)(1) of the Act, 21 U.S.C. section 360bbb-3(b)(1), unless the authorization is terminated or revoked.  Performed at [facility], [address], [place], [place] [address]   Blood culture (routine x 2)     Status: None   Collection Time: 08/02/20  1:50 AM   Specimen: BLOOD RIGHT HAND  Result Value Ref Range Status   Specimen Description BLOOD RIGHT HAND  Final   Special Requests   Final    BOTTLES DRAWN AEROBIC AND ANAEROBIC Blood Culture adequate volume   Culture   Final    NO GROWTH 6 DAYS Performed at [facility], [address], [place], [place] [address]    Report Status 08/08/2020 FINAL  Final  MRSA PCR Screening     Status: None   Collection Time: 08/02/20  4:40 AM   Specimen: Nasal Mucosa; Nasopharyngeal  Result Value Ref Range Status   MRSA by PCR NEGATIVE NEGATIVE Final    Comment:        The GeneXpert MRSA Assay (FDA approved for NASAL specimens only), is one component of a comprehensive MRSA colonization surveillance program. It is not intended to diagnose MRSA infection nor to guide or monitor treatment for MRSA infections. Performed at [facility], [address], [place], [place] [address]   Culture, respiratory (non-expectorated)     Status: None   Collection Time: 08/02/20  6:12 AM   Specimen: Tracheal Aspirate; Respiratory  Result Value Ref Range Status   Specimen Description TRACHEAL ASPIRATE  Final   Special Requests NONE  Final   Gram Stain   Final    ABUNDANT WBC PRESENT, PREDOMINANTLY PMN MODERATE GRAM NEGATIVE COCCOBACILLI FEW GRAM POSITIVE COCCI    Culture   Final    FEW Normal respiratory flora-no Staph aureus or Pseudomonas  seen Performed at [facility], [address] [place], [place] [address]    Report Status 08/04/2020 FINAL  Final  Respiratory Panel  by PCR     Status: None   Collection Time: 08/02/20  1:01 PM   Specimen: Nasopharyngeal Swab; Respiratory  Result Value Ref Range Status   Adenovirus NOT DETECTED NOT DETECTED Final   Coronavirus 229E NOT DETECTED NOT DETECTED Final    Comment: (NOTE) The Coronavirus on the Respiratory Panel, DOES NOT test for the novel  Coronavirus (2019 nCoV)    Coronavirus HKU1 NOT DETECTED NOT DETECTED Final   Coronavirus NL63 NOT DETECTED NOT DETECTED Final   Coronavirus OC43 NOT DETECTED NOT DETECTED Final   Metapneumovirus NOT DETECTED NOT DETECTED Final   Rhinovirus / Enterovirus NOT DETECTED NOT DETECTED Final   Influenza A NOT DETECTED NOT DETECTED Final   Influenza B NOT DETECTED NOT DETECTED Final   Parainfluenza Virus 1 NOT DETECTED NOT DETECTED Final   Parainfluenza Virus 2 NOT DETECTED NOT DETECTED Final   Parainfluenza Virus 3 NOT DETECTED NOT DETECTED Final   Parainfluenza Virus 4 NOT DETECTED NOT DETECTED Final   Respiratory Syncytial Virus NOT DETECTED NOT DETECTED Final   Bordetella pertussis NOT DETECTED NOT DETECTED Final   Bordetella Parapertussis NOT DETECTED NOT DETECTED Final   Chlamydophila pneumoniae NOT DETECTED NOT DETECTED Final   Mycoplasma pneumoniae NOT DETECTED NOT DETECTED Final    Comment: Performed at [facility], [address] [place], [place] [address]    RN Pressure Injury Documentation:     Estimated body mass index is 40.9 kg/m as calculated from the following:   Height as of this encounter:  (1.676 m).   Weight as of this encounter: 114.9 kg.  Malnutrition Type:  Nutrition Problem: Inadequate oral intake Etiology: dysphagia   Malnutrition Characteristics:  Signs/Symptoms: meal completion < 50%   Nutrition Interventions:  Interventions: MVI,Ensure Enlive (each supplement provides  350kcal and 20 grams of protein)    Radiology Studies: DG CHEST PORT 1 VIEW  Result Date: 08/09/2020 CLINICAL DATA:  Status post CPR.  Evaluate. EXAM: PORTABLE CHEST 1 VIEW COMPARISON:  August 08, 2020 FINDINGS: The heart, hila, and mediastinum are normal. No pneumothorax identified. Mild atelectasis in the bases. No suspicious infiltrates. No overt edema. IMPRESSION: No active disease. Electronically Signed   By: Gerome Sam III M.D   On: 08/09/2020 09:54   DG CHEST PORT 1 VIEW  Result Date: 08/08/2020 CLINICAL DATA:  Shortness of breath. EXAM: PORTABLE CHEST 1 VIEW COMPARISON:  August 02, 2020. FINDINGS: The heart size and mediastinal contours are within normal limits. Endotracheal and nasogastric tubes have been removed. No pneumothorax or pleural effusion is noted. Mild reticulonodular densities are noted in both lung bases concerning for possible atypical inflammation. The visualized skeletal structures are unremarkable. IMPRESSION: Mild reticulonodular densities are noted in both lung bases concerning for possible atypical inflammation. Electronically Signed   By: Lupita Raider M.D.   On: 08/08/2020 08:28   DG Swallowing Func-Speech Pathology  Result Date: 08/08/2020 Objective Swallowing Evaluation: Type of Study: MBS-Modified Barium Swallow Study  Patient Details Name: Marie Delacruz MRN: [id number] Date of Birth: [birth year] Today's Date: 08/08/2020 Time: SLP Start Time (ACUTE ONLY): 1010 -SLP Stop Time (ACUTE ONLY): 1025 SLP Time Calculation (min) (ACUTE ONLY): 15 min Past Medical History: Past Medical History: Diagnosis Date . Anxiety  . COPD (chronic obstructive pulmonary disease) (HCC)  . Depression  . HLD (hyperlipidemia)  . HTN (hypertension)  . Hypothyroid  . 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 Obesity  . PONV (postoperative nausea and vomiting)  . Prolapse of posterior vaginal wall  Past Surgical  History: Past Surgical History: Procedure Laterality Date . BACK SURGERY   . CYSTOSCOPY N/A 05/08/2020  Procedure: CYSTOSCOPY;  Surgeon: Ranae PilaLeger, Elise Jennifer, MD;  [facility];  Service: Gynecology;  Laterality: N/A; . FACIAL COSMETIC SURGERY   . RECTOCELE REPAIR N/A 05/08/2020  Procedure: POSTERIOR REPAIR (RECTOCELE), MCCALLS,;  Surgeon: Ranae PilaLeger, Elise Jennifer, MD;  [facility] [place];  Service: Gynecology;  Laterality: N/A; . SPINAL FUSION   . TUBAL LIGATION   . VAGINAL HYSTERECTOMY N/A 05/08/2020  Procedure: HYSTERECTOMY VAGINAL;  Surgeon: Ranae PilaLeger, Elise Jennifer, MD;  [facility];  Service: Gynecology;  Laterality: N/A;  need bed HPI: 54 year old woman with hx of copd, depression, HTN, HLD, hypothyroidism, here after sudden loss of consciousness.  ETT 12/11-12/12.  Subjective: at bedside with spouse present, lethargic Assessment / Plan / Recommendation CHL IP CLINICAL IMPRESSIONS 08/08/2020 Clinical Impression Pt's presentation during the swallow study was improved compared to that demonstrated on 12/16. She was alert and conversant on this date and joking with the SLP. Moaning was still intermittently noted, but pt denied pain. Pt exhibited mild oral holding with puree which she attributed to its flavor, but this was not observed with other consistencies and she responsive to verbal cues. Transient penetration (PAS 2) was observed with large boluses of thin liquids via straw; however, this is WNL and no instances of aspiration were observed. Transit of the barium tablet was slow at the level of the mid thoracic esophagus, but movement was facilitated with additional liquid boluses. Her overall [facility]roph[facility]. A dysphagia 3 diet with thin liquids is recommended at this time and SLP will continue to follow. SLP Visit Diagnosis Dysphagia, unspecified (R13.10) Attention and concentration deficit following --  Frontal lobe and executive function deficit following -- Impact on safety and function Mild aspiration risk   CHL IP TREATMENT RECOMMENDATION 08/08/2020 Treatment Recommendations Therapy as outlined in treatment plan below   Prognosis 08/08/2020 Prognosis for Safe Diet Advancement Good Barriers to Reach Goals Time post onset;Cognitive deficits Barriers/Prognosis Comment -- CHL IP DIET RECOMMENDATION 08/08/2020 SLP Diet Recommendations Dysphagia 3 (Mech soft) solids;Thin liquid Liquid Administration via Cup;Straw Medication Administration Whole meds with puree Compensations Small sips/bites;Minimize environmental distractions Postural Changes Remain semi-upright after after feeds/meals (Comment)   CHL IP OTHER RECOMMENDATIONS 08/08/2020 Recommended Consults -- Oral Care Recommendations Oral care BID Other Recommendations --   CHL IP FOLLOW UP RECOMMENDATIONS 08/08/2020 Follow up Recommendations None   CHL IP FREQUENCY AND DURATION 08/08/2020 Speech Therapy Frequency (ACUTE ONLY) min 2x/week Treatment Duration 2 weeks      CHL IP ORAL PHASE 08/08/2020 Oral Phase WFL Oral - Pudding Teaspoon -- Oral - Pudding Cup -- Oral - Honey Teaspoon -- Oral - Honey Cup -- Oral - Nectar Teaspoon -- Oral - Nectar Cup -- Oral - Nectar Straw -- Oral - Thin Teaspoon -- Oral - Thin Cup -- Oral - Thin Straw -- Oral - Puree -- Oral - Mech Soft -- Oral - Regular -- Oral - Multi-Consistency -- Oral - Pill -- Oral Phase - Comment --  CHL IP PHARYNGEAL PHASE 08/08/2020 Pharyngeal Phase WFL Pharyngeal- Pudding Teaspoon -- Pharyngeal -- Pharyngeal- Pudding Cup -- Pharyngeal -- Pharyngeal- Honey Teaspoon -- Pharyngeal -- Pharyngeal- Honey Cup -- Pharyngeal -- Pharyngeal- Nectar Teaspoon -- Pharyngeal -- Pharyngeal- Nectar Cup --  Pharyngeal -- Pharyngeal- Nectar Straw -- Pharyngeal -- Pharyngeal- Thin Teaspoon -- Pharyngeal -- Pharyngeal- Thin Cup WFL Pharyngeal -- Pharyngeal- Thin Straw Penetration/Aspiration during swallow Pharyngeal Material  enters airway, remains ABOVE vocal cords then ejected out Pharyngeal- Puree -- Pharyngeal -- Pharyngeal- Mechanical Soft -- Pharyngeal -- Pharyngeal- Regular -- Pharyngeal -- Pharyngeal- Multi-consistency -- Pharyngeal -- Pharyngeal- Pill -- Pharyngeal -- Pharyngeal Comment --  CHL IP CERVICAL ESOPHAGEAL PHASE 08/08/2020 Cervical Esophageal Phase WFL Pudding Teaspoon -- Pudding Cup -- Honey Teaspoon -- Honey Cup -- Nectar Teaspoon -- Nectar Cup -- Nectar Straw -- Thin Teaspoon -- Thin Cup -- Thin Straw -- Puree -- Mechanical Soft -- Regular -- Multi-consistency -- Pill -- Cervical Esophageal Comment -- Shanika I. Vear Clock, MS, CCC-SLP Acute Rehabilitation Services Office number [phone] Pager [phone] Scheryl Marten 08/08/2020, 10:47 AM               Scheduled Meds: . arformoterol  15 mcg Nebulization BID  . atorvastatin  40 mg Oral QPM  . chlorhexidine  15 mL Mouth Rinse BID  . Chlorhexidine Gluconate Cloth  6 each Topical Daily  . DULoxetine  60 mg Oral Daily  . enoxaparin (LOVENOX) injection  40 mg Subcutaneous Q24H  . feeding supplement  237 mL Oral TID BM  . guaiFENesin  1,200 mg Oral BID  . insulin aspart  0-15 Units Subcutaneous Q4H  . levothyroxine  75 mcg Oral Q0600  . lisinopril  20 mg Oral Daily  . mouth rinse  15 mL Mouth Rinse q12n4p  . pantoprazole  40 mg Oral Daily  . phosphorus  500 mg Oral BID  . revefenacin  175 mcg Nebulization Daily  . venlafaxine XR  150 mg Oral Q breakfast   Continuous Infusions: . sodium chloride 10 mL/hr at 08/06/20 0800    LOS: 7 days   Merlene Laughter, DO [facility] PAGER is on AMION  If 7PM-7AM, please contact night-coverage [email or web address]

## 2020-08-09 NOTE — Progress Notes (Signed)
Pt desatting to 88-89% on room air. RN placed patient on 2L [place].

## 2020-08-09 NOTE — Plan of Care (Signed)

## 2020-08-10 ENCOUNTER — Inpatient Hospital Stay (HOSPITAL_COMMUNITY): Payer: 59

## 2020-08-10 LAB — CBC WITH DIFFERENTIAL/PLATELET
Abs Immature Granulocytes: 0.74 10*3/uL — ABNORMAL HIGH (ref 0.00–0.07)
Basophils Absolute: 0.1 10*3/uL (ref 0.0–0.1)
Basophils Relative: 1 %
Eosinophils Absolute: 0.6 10*3/uL — ABNORMAL HIGH (ref 0.0–0.5)
Eosinophils Relative: 4 %
HCT: 40.8 % (ref 36.0–46.0)
Hemoglobin: 13.7 g/dL (ref 12.0–15.0)
Immature Granulocytes: 5 %
Lymphocytes Relative: 24 %
Lymphs Abs: 3.8 10*3/uL (ref 0.7–4.0)
MCH: 31.6 pg (ref 26.0–34.0)
MCHC: 33.6 g/dL (ref 30.0–36.0)
MCV: 94.2 fL (ref 80.0–100.0)
Monocytes Absolute: 1.2 10*3/uL — ABNORMAL HIGH (ref 0.1–1.0)
Monocytes Relative: 8 %
Neutro Abs: 9.3 10*3/uL — ABNORMAL HIGH (ref 1.7–7.7)
Neutrophils Relative %: 58 %
Platelets: 386 10*3/uL (ref 150–400)
RBC: 4.33 MIL/uL (ref 3.87–5.11)
RDW: 14.3 % (ref 11.5–15.5)
WBC: 15.8 10*3/uL — ABNORMAL HIGH (ref 4.0–10.5)
nRBC: 0 % (ref 0.0–0.2)

## 2020-08-10 LAB — COMPREHENSIVE METABOLIC PANEL
ALT: 47 U/L — ABNORMAL HIGH (ref 0–44)
AST: 27 U/L (ref 15–41)
Albumin: 3.3 g/dL — ABNORMAL LOW (ref 3.5–5.0)
Alkaline Phosphatase: 62 U/L (ref 38–126)
Anion gap: 10 (ref 5–15)
BUN: 15 mg/dL (ref 6–20)
CO2: 29 mmol/L (ref 22–32)
Calcium: 9.8 mg/dL (ref 8.9–10.3)
Chloride: 101 mmol/L (ref 98–111)
Creatinine, Ser: 0.89 mg/dL (ref 0.44–1.00)
GFR, Estimated: >60 mL/min (ref >60)
Glucose, Bld: 95 mg/dL (ref 70–99)
Potassium: 3.7 mmol/L (ref 3.5–5.1)
Sodium: 140 mmol/L (ref 135–145)
Total Bilirubin: 0.7 mg/dL (ref 0.3–1.2)
Total Protein: 6.1 g/dL — ABNORMAL LOW (ref 6.5–8.1)

## 2020-08-10 LAB — GLUCOSE, CAPILLARY
Glucose-Capillary: 93 mg/dL (ref 70–99)
Glucose-Capillary: 103 mg/dL — ABNORMAL HIGH (ref 70–99)
Glucose-Capillary: 90 mg/dL (ref 70–99)

## 2020-08-10 LAB — PHOSPHORUS: Phosphorus: 4.1 mg/dL (ref 2.5–4.6)

## 2020-08-10 LAB — MAGNESIUM: Magnesium: 2.2 mg/dL (ref 1.7–2.4)

## 2020-08-10 MED ORDER — VARENICLINE TARTRATE 0.5 MG PO TABS: ORAL_TABLET | ORAL | 0 refills | Status: AC

## 2020-08-10 MED ORDER — ALBUTEROL SULFATE HFA 108 (90 BASE) MCG/ACT IN AERS: 2.0000 | INHALATION_SPRAY | Freq: Four times a day (QID) | RESPIRATORY_TRACT | 2 refills | Status: AC | PRN

## 2020-08-10 MED ORDER — FLUTICASONE PROPIONATE HFA 110 MCG/ACT IN AERO: 2.0000 | INHALATION_SPRAY | Freq: Two times a day (BID) | RESPIRATORY_TRACT | 2 refills | Status: AC

## 2020-08-10 NOTE — Progress Notes (Signed)
SATURATION QUALIFICATIONS: (This note is used to comply with regulatory documentation for home oxygen)  Patient Saturations on Room Air at Rest = 93%  Patient Saturations on Room Air while Ambulating = 89% 

## 2020-08-10 NOTE — TOC Transition Note (Addendum)
Transition of Care [facility]) - CM/SW Discharge Note   Patient Details  Name: SATIN BOAL MRN: [id number] Date of Birth: Oct 18, 1965  Transition of Care [facility]) CM/SW Contact:  Kermit Balo, RN Phone Number: 08/10/2020, 12:45 PM   Clinical Narrative:    Pt is discharging home with [facility] through [facility].  DME to be delivered to the room per [employer].  Pt has supervision at home and transportation to home.   1335: pt did not qualify for home oxygen. MD aware. She will receive walker and 3 in 1.   Final next level of care: Home w Home Health Services Barriers to Discharge: No Barriers Identified   Patient Goals and CMS Choice   CMS Medicare.gov Compare Post Acute Care list provided to:: Patient Choice offered to / list presented to : Patient  Discharge Placement                       Discharge Plan and Services                DME Arranged: 3-N-1,Oxygen,Walker rolling DME Agency: [employer] Date DME Agency Contacted: 08/10/20   Representative spoke with at DME Agency: [employer] HH Arranged: PT,OT HH Agency: [facility] [employer] Date HH Agency Contacted: 08/10/20   Representative spoke with at [facility] Agency: Rosey Bath  Social Determinants of Health (SDOH) Interventions     Readmission Risk Interventions No flowsheet data found.

## 2020-08-10 NOTE — Discharge Summary (Signed)
Physician Discharge Summary  AMARYS SLIWINSKI ZOX:[id number] DOB: November 03, 1965 DOA: 08/01/2020  PCP: [facility]  Admit date: 08/01/2020 Discharge date: 08/10/2020  Admitted From: Home  Disposition:  Home with [facility]   Recommendations for Outpatient Follow-up:  1. Follow up with PCP in 1-2 weeks 2. [place]: Please obtain PFTs for COPD and adjust inhalers as needed 3. [place]: Please follow up smoking cessation    Home Health: PT/OT due to ongoing SOB with exertion  Equipment/Devices: 3-in-1, walker  Discharge Condition: Fair  CODE STATUS: FULL Diet recommendation: Regular  Brief/Interim Summary: Mrs. Boultinghouse is a 54 y.o. F with smoking, depression, HTN and hypothyroidism who presented after passing out.    In the ER, found to have hypercapnic and hypoxic respiratory failure and was intubated.     PRINCIPAL HOSPITAL DIAGNOSIS: Acute respiratory failure with hypoxia and hypercarbia due to COPD exacerbation and pneumonia    Discharge Diagnoses:   Acute respiratory failure with hypoxia and hypercarbia due to COPD exacerbation and pneumonia Patient was admitted with hypercarbia, hypoxia, requiring intubation.  CT of the chest showed hazy bilateral groundglass airspace opacities consistent with atypical pneumonia.  She was started on steroids, empiric antibiotics (azithromycin, and Vanc/Zosyn transitioned to ceftriaxone for total 6 days).  She improved.  Patient now mentating at baseline, taking orals.  Temp < 100 F, heart rate < 100bpm, RR < 24, SpO2 at baseline.   Stable for discharge.    Discharged on inhaled steroid in addition to previous Anoro.  PCP follow up stressed.   Smoking Smoking cessation recommended.  Discharged on new Chantix.  Hypertension Continue lisinopril  Mood disorder Continue desvenlafaxine, duloxetine  Hypothyroidism Continue levothyroxine  Diarrhea, post-antibiotic The patient did have few days of diarrhea after completing  antibiotics.  This resolved on its own, she had no fever or abdominal cramps, and her stools were solidified and so C diff was considered extremely unlikely.    Discharge Instructions  Discharge Instructions    Diet - low sodium heart healthy   Complete by: As directed    Discharge instructions   Complete by: As directed    From Dr. Marland Mcalpine and Dr. Maryfrances Bunnell: You were admitted for pneumonia and a flare of smoking-related lung disease. Together, these two things caused complete lung failure  Because of the lung failure, you were confused and had to be put on life support (in other words, put on a ventilator machine to control your breathing)  We performed a CAT scan of your lungs that showed some pneumonia, but no heart problems and no evidence of a blood clot or anything more serious than pneumonia.  We suspect that you have some COPD (this is a smoking related lung disease, similar to emphysema) that flares up from times to time.  You were treated with strong antibiotics for a complete course to treat your pneumonia You were also treated with steroids by the lung specialists.  Go see your primary care WITHIN 1 week  Have them wean your oxygen off  IN the meantime, to make sure the COPD doesn't flare again: 1. Continue your Anoro inhaler 2. Use albuterol twice daily for the next week, then after that use only as needed (for feeling short of breath) 3. Start the new inhaler Flovent 110 twice daily 4. Use the flutter valve three times a day  For smoking, I have sent a prescription to [facility] for Chantix. This can be hard to find right now, so if they  don't have it, I will call in something else.   Increase activity slowly   Complete by: As directed    No wound care   Complete by: As directed      Allergies as of 08/10/2020   No Known Allergies     Medication List    TAKE these medications   albuterol 108 (90 Base) MCG/ACT inhaler Commonly known as: VENTOLIN  HFA Inhale 2 puffs into the lungs every 6 (six) hours as needed for wheezing or shortness of breath.   Anoro Ellipta 62.5-25 MCG/INH Aepb Generic drug: umeclidinium-vilanterol Inhale 1 puff into the lungs daily.   atorvastatin 40 MG tablet Commonly known as: LIPITOR Take 40 mg by mouth at bedtime.   Calcium Carb-Cholecalciferol 500-125 MG-UNIT Tabs Take 1 tablet by mouth daily.   desvenlafaxine 100 MG 24 hr tablet Commonly known as: PRISTIQ Take 100 mg by mouth daily.   docusate sodium 100 MG capsule Commonly known as: Colace Take 1 capsule (100 mg total) by mouth 2 (two) times daily.   DULoxetine 60 MG capsule Commonly known as: CYMBALTA Take 60 mg by mouth daily.   fluticasone 110 MCG/ACT inhaler Commonly known as: FLOVENT HFA Inhale 2 puffs into the lungs 2 (two) times daily.   fluticasone 50 MCG/ACT nasal spray Commonly known as: FLONASE Place 1 spray into both nostrils daily as needed for allergies or rhinitis.   ibuprofen 600 MG tablet Commonly known as: ADVIL Take 1 tablet (600 mg total) by mouth every 6 (six) hours as needed.   levothyroxine 75 MCG tablet Commonly known as: SYNTHROID Take 75 mcg by mouth daily before breakfast.   lisinopril 20 MG tablet Commonly known as: ZESTRIL Take 20 mg by mouth daily.   multivitamin with minerals Tabs tablet Take 1 tablet by mouth daily.   nicotine 21 mg/24hr patch Commonly known as: NICODERM CQ - dosed in mg/24 hours Place 21 mg onto the skin daily.   traMADol 50 MG tablet Commonly known as: ULTRAM Take 1 tablet (50 mg total) by mouth every 4 (four) hours as needed for moderate pain.   triamcinolone 0.1 % Commonly known as: KENALOG Apply 1 application topically daily as needed (psoriasis).   varenicline 0.5 MG tablet Commonly known as: Chantix Take 0.5 mg (1 tab) once daily for 3 days then take 0.5 mg (1 tab) twice daily for days 4 through 7 then increase to take 1 mg (2 tabs) twice daily   Vitamin D 50  MCG (2000 UT) Caps Take 2,000 Units by mouth daily.            Durable Medical Equipment  (From admission, onward)         Start     Ordered   08/10/20 1244  For home use only DME 3 n 1  Once        08/10/20 1243   08/10/20 1244  For home use only DME Walker rolling  Once       Question Answer Comment  Walker: With 5 Inch Wheels   Patient needs a walker to treat with the following condition Weakness      08/10/20 1243   08/10/20 1053  DME Oxygen  (Discharge Planning)  Once       Question Answer Comment  Length of Need 6 Months   Mode or (Route) Nasal cannula   Liters per Minute 2   Frequency Continuous (stationary and portable oxygen unit needed)   Oxygen delivery system Gas  08/10/20 1052          Follow-up Information    [facility] Follow up.   Specialty: Home Health Services Why: The home health agency will contact you for the first home visit Contact information: [address] [place] [address] [phone]        [facility]. Schedule an appointment as soon as possible for a visit in 1 week(s).   Specialty: Family Medicine Contact information: [address] [place] [address] [phone]              No Known Allergies  Consultations:  Pulmonology in ICU   Procedures/Studies: CT Head Wo Contrast  Result Date: 08/02/2020 CLINICAL DATA:  Mental status changes EXAM: CT HEAD WITHOUT CONTRAST TECHNIQUE: Contiguous axial images were obtained from the base of the skull through the vertex without intravenous contrast. COMPARISON:  None. FINDINGS: Brain: No acute intracranial abnormality. Specifically, no hemorrhage, hydrocephalus, mass lesion, acute infarction, or significant intracranial injury. Vascular: No hyperdense vessel or unexpected calcification. Skull: No acute calvarial abnormality. Sinuses/Orbits: Air-fluid levels in the sphenoid sinuses. Other: None IMPRESSION: No intracranial abnormality.  Acute sphenoid sinusitis. Electronically Signed   By: Charlett Nose M.D.   On: 08/02/2020 00:29   CT Angio Chest PE W and/or Wo Contrast  Result Date: 08/02/2020 CLINICAL DATA:  Positive D-dimer.  Pulmonary embolism suspected. EXAM: CT ANGIOGRAPHY CHEST WITH CONTRAST TECHNIQUE: Multidetector CT imaging of the chest was performed using the standard protocol during bolus administration of intravenous contrast. Multiplanar CT image reconstructions and MIPs were obtained to evaluate the vascular anatomy. CONTRAST:  OMNIPAQUE IOHEXOL 350 MG/ML SOLN COMPARISON:  None. FINDINGS: Cardiovascular: Satisfactory contrast bolus. Beam attenuation caused by patient body habitus limits assessment beyond the proximal segmental level. There is no pulmonary embolus or evidence of right heart strain. The size of the main pulmonary artery is normal. Heart size is normal, with no pericardial effusion. The course and caliber of the aorta are normal. There is no atherosclerotic calcification. Opacification decreased due to pulmonary arterial phase contrast bolus timing. Mediastinum/Nodes: -- No mediastinal lymphadenopathy. -- No hilar lymphadenopathy. -- No axillary lymphadenopathy. -- No supraclavicular lymphadenopathy. -- Normal thyroid gland where visualized. -  Unremarkable esophagus. Lungs/Pleura: Hazy bilateral ground-glass airspace opacities are noted. The trachea terminates above the carina. There is no pneumothorax. No large pleural effusion. There is a trace right-sided pleural effusion. Upper Abdomen: Contrast bolus timing is not optimized for evaluation of the abdominal organs. The enteric tube extends into the stomach. The tip is not visualized on this study. Musculoskeletal: No chest wall abnormality. No bony spinal canal stenosis. Review of the MIP images confirms the above findings. IMPRESSION: 1. No acute pulmonary embolism. 2. Hazy bilateral ground-glass airspace opacities, concerning for an atypical infectious  process such as viral pneumonia or developing pulmonary edema. 3. Trace right-sided pleural effusion. Electronically Signed   By: Katherine Mantle M.D.   On: 08/02/2020 02:01   DG CHEST PORT 1 VIEW  Result Date: 08/10/2020 CLINICAL DATA:  Shortness of breath. EXAM: PORTABLE CHEST 1 VIEW COMPARISON:  August 09, 2020 FINDINGS: The heart size remains borderline. The hila and mediastinum are unremarkable. No pneumothorax. No nodules or masses. No focal infiltrates. IMPRESSION: No active disease. Electronically Signed   By: Gerome Sam III M.D   On: 08/10/2020 10:41   DG CHEST PORT 1 VIEW  Result Date: 08/09/2020 CLINICAL DATA:  Status post CPR.  Evaluate. EXAM: PORTABLE CHEST 1  VIEW COMPARISON:  August 08, 2020 FINDINGS: The heart, hila, and mediastinum are normal. No pneumothorax identified. Mild atelectasis in the bases. No suspicious infiltrates. No overt edema. IMPRESSION: No active disease. Electronically Signed   By: Gerome Sam III M.D   On: 08/09/2020 09:54   DG CHEST PORT 1 VIEW  Result Date: 08/08/2020 CLINICAL DATA:  Shortness of breath. EXAM: PORTABLE CHEST 1 VIEW COMPARISON:  August 02, 2020. FINDINGS: The heart size and mediastinal contours are within normal limits. Endotracheal and nasogastric tubes have been removed. No pneumothorax or pleural effusion is noted. Mild reticulonodular densities are noted in both lung bases concerning for possible atypical inflammation. The visualized skeletal structures are unremarkable. IMPRESSION: Mild reticulonodular densities are noted in both lung bases concerning for possible atypical inflammation. Electronically Signed   By: Lupita Raider M.D.   On: 08/08/2020 08:28   DG Chest Portable 1 View  Result Date: 08/02/2020 CLINICAL DATA:  Intubation EXAM: PORTABLE CHEST 1 VIEW COMPARISON:  01/14/2014 FINDINGS: endotracheal tube 4.7 cm above the carina. NG tube is in the stomach. Heart is normal size. No confluent opacities or  effusions. No acute bony abnormality. IMPRESSION: Endotracheal tube 4.7 cm above the carina. No acute cardiopulmonary disease. Electronically Signed   By: Charlett Nose M.D.   On: 08/02/2020 00:16   DG Swallowing Func-Speech Pathology  Result Date: 08/08/2020 Objective Swallowing Evaluation: Type of Study: MBS-Modified Barium Swallow Study  Patient Details Name: LIND AUSLEY MRN: [id number] Date of Birth: 1965-12-03 Today's Date: 08/08/2020 Time: SLP Start Time (ACUTE ONLY): 1010 -SLP Stop Time (ACUTE ONLY): 1025 SLP Time Calculation (min) (ACUTE ONLY): 15 min Past Medical History: Past Medical History: Diagnosis Date . Anxiety  . COPD (chronic obstructive pulmonary disease) (HCC)  . Depression  . HLD (hyperlipidemia)  . HTN (hypertension)  . Hypothyroid  . 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 Obesity  . PONV (postoperative nausea and vomiting)  . Prolapse of posterior vaginal wall  Past Surgical History: Past Surgical History: Procedure Laterality Date . BACK SURGERY   . CYSTOSCOPY N/A 05/08/2020  Procedure: CYSTOSCOPY;  Surgeon: Ranae Pila, MD;  Location: [facility];  Service: Gynecology;  Laterality: N/A; . FACIAL COSMETIC SURGERY   . RECTOCELE REPAIR N/A 05/08/2020  Procedure: POSTERIOR REPAIR (RECTOCELE), MCCALLS,;  Surgeon: Ranae Pila, MD;  Location: [facility];  Service: Gynecology;  Laterality: N/A; . SPINAL FUSION   . TUBAL LIGATION   . VAGINAL HYSTERECTOMY N/A 05/08/2020  Procedure: HYSTERECTOMY VAGINAL;  Surgeon: Ranae Pila, MD;  Location: [facility];  Service: Gynecology;  Laterality: N/A;  need bed HPI: 54 year old woman with hx of copd, depression, HTN, HLD, hypothyroidism, here after sudden loss of consciousness.  ETT 12/11-12/12.  Subjective: at bedside with spouse present, lethargic Assessment / Plan / Recommendation CHL IP CLINICAL IMPRESSIONS  08/08/2020 Clinical Impression Pt's presentation during the swallow study was improved compared to that demonstrated on 12/16. She was alert and conversant on this date and joking with the SLP. Moaning was still intermittently noted, but pt denied pain. Pt exhibited mild oral holding with puree which she attributed to its flavor, but this was not observed with other consistencies and she responsive to verbal cues. Transient penetration (PAS 2) was observed with large boluses of thin liquids via straw; however, this is WNL and no instances of aspiration were observed.  Transit of the barium tablet was slow at the level of the mid thoracic esophagus, but movement was facilitated with additional liquid boluses. Her overall oropharyngeal swallow function was [facility] during this study. A dysphagia 3 diet with thin liquids is recommended at this time and SLP will continue to follow. SLP Visit Diagnosis Dysphagia, unspecified (R13.10) Attention and concentration deficit following -- Frontal lobe and executive function deficit following -- Impact on safety and function Mild aspiration risk   CHL IP TREATMENT RECOMMENDATION 08/08/2020 Treatment Recommendations Therapy as outlined in treatment plan below   Prognosis 08/08/2020 Prognosis for Safe Diet Advancement Good Barriers to Reach Goals Time post onset;Cognitive deficits Barriers/Prognosis Comment -- CHL IP DIET RECOMMENDATION 08/08/2020 SLP Diet Recommendations Dysphagia 3 (Mech soft) solids;Thin liquid Liquid Administration via Cup;Straw Medication Administration Whole meds with puree Compensations Small sips/bites;Minimize environmental distractions Postural Changes Remain semi-upright after after feeds/meals (Comment)   CHL IP OTHER RECOMMENDATIONS 08/08/2020 Recommended Consults -- Oral Care Recommendations Oral care BID Other Recommendations --   CHL IP FOLLOW UP RECOMMENDATIONS 08/08/2020 Follow up Recommendations None   CHL IP FREQUENCY AND DURATION 08/08/2020 Speech  Therapy Frequency (ACUTE ONLY) min 2x/week Treatment Duration 2 weeks      CHL IP ORAL PHASE 08/08/2020 Oral Phase WFL Oral - Pudding Teaspoon -- Oral - Pudding Cup -- Oral - Honey Teaspoon -- Oral - Honey Cup -- Oral - Nectar Teaspoon -- Oral - Nectar Cup -- Oral - Nectar Straw -- Oral - Thin Teaspoon -- Oral - Thin Cup -- Oral - Thin Straw -- Oral - Puree -- Oral - Mech Soft -- Oral - Regular -- Oral - Multi-Consistency -- Oral - Pill -- Oral Phase - Comment --  CHL IP PHARYNGEAL PHASE 08/08/2020 Pharyngeal Phase WFL Pharyngeal- Pudding Teaspoon -- Pharyngeal -- Pharyngeal- Pudding Cup -- Pharyngeal -- Pharyngeal- Honey Teaspoon -- Pharyngeal -- Pharyngeal- Honey Cup -- Pharyngeal -- Pharyngeal- Nectar Teaspoon -- Pharyngeal -- Pharyngeal- Nectar Cup -- Pharyngeal -- Pharyngeal- Nectar Straw -- Pharyngeal -- Pharyngeal- Thin Teaspoon -- Pharyngeal -- Pharyngeal- Thin Cup WFL Pharyngeal -- Pharyngeal- Thin Straw Penetration/Aspiration during swallow Pharyngeal Material enters airway, remains ABOVE vocal cords then ejected out Pharyngeal- Puree -- Pharyngeal -- Pharyngeal- Mechanical Soft -- Pharyngeal -- Pharyngeal- Regular -- Pharyngeal -- Pharyngeal- Multi-consistency -- Pharyngeal -- Pharyngeal- Pill -- Pharyngeal -- Pharyngeal Comment --  CHL IP CERVICAL ESOPHAGEAL PHASE 08/08/2020 Cervical Esophageal Phase WFL Pudding Teaspoon -- Pudding Cup -- Honey Teaspoon -- Honey Cup -- Nectar Teaspoon -- Nectar Cup -- Nectar Straw -- Thin Teaspoon -- Thin Cup -- Thin Straw -- Puree -- Mechanical Soft -- Regular -- Multi-consistency -- Pill -- Cervical Esophageal Comment -- Shanika I. Vear Clock, MS, CCC-SLP Acute Rehabilitation Services Office number [phone] Pager [phone] Scheryl Marten 08/08/2020, 10:47 AM              ECHOCARDIOGRAM COMPLETE  Result Date: 08/02/2020    ECHOCARDIOGRAM REPORT   Patient Name:   BRILEIGH SEVCIK Date of Exam: 08/02/2020 Medical Rec #:  [id number]        Height:       66.0  in Accession #:    [id number]       Weight:       244.9 lb Date of Birth:  03-22-1966       BSA:          2.180 m Patient Age:    54 years         BP:  94/68 mmHg Patient Gender: F                HR:           83 bpm. Exam Location:  Inpatient Procedure: 2D Echo, Cardiac Doppler, Color Doppler and Intracardiac            Opacification Agent STAT ECHO Indications:    CHF  History:        Patient has no prior history of Echocardiogram examinations.                 COPD; Risk Factors:Hypertension.  Sonographer:    Ross Ludwig RDCS (AE) Referring Phys: [id number] [facility]  Sonographer Comments: Suboptimal parasternal window, suboptimal apical window, suboptimal subcostal window, Technically challenging study due to limited acoustic windows and echo performed with patient supine and on artificial respirator. IMPRESSIONS  1. Left ventricular ejection fraction, by estimation, is 50 to 55%. The left ventricle has low normal function. The left ventricle has no regional wall motion abnormalities. Left ventricular diastolic parameters were normal. Estimated Cardiac Output by LVOT VTI is 5.3 L/min.  2. Right ventricular systolic function is normal. The right ventricular size is normal.  3. The mitral valve is normal in structure. No evidence of mitral valve regurgitation.  4. The aortic valve was not well visualized. Aortic valve regurgitation is not visualized.  5. The inferior vena cava is dilated in size with <50% respiratory variability, suggesting right atrial pressure of 15 mmHg; patient presently intubated. Comparison(s): No prior Echocardiogram. FINDINGS  Left Ventricle: Estimated Cardiac Output by LVOT VTI is 5.3 L/min. Left ventricular ejection fraction, by estimation, is 50 to 55%. The left ventricle has low normal function. The left ventricle has no regional wall motion abnormalities. Definity contrast agent was given IV to delineate the left ventricular endocardial borders. The left ventricular  internal cavity size was normal in size. There is no left ventricular hypertrophy. Left ventricular diastolic parameters were normal. Right Ventricle: The right ventricular size is normal. Right vetricular wall thickness was not well visualized. Right ventricular systolic function is normal. Left Atrium: Left atrial size was normal in size. Right Atrium: Right atrial size was normal in size. Pericardium: Trivial pericardial effusion is present. Mitral Valve: The mitral valve is normal in structure. No evidence of mitral valve regurgitation. MV peak gradient, 2.6 mmHg. The mean mitral valve gradient is 1.0 mmHg. Tricuspid Valve: The tricuspid valve is grossly normal. Tricuspid valve regurgitation is not demonstrated. Aortic Valve: The aortic valve was not well visualized. Aortic valve regurgitation is not visualized. Aortic valve mean gradient measures 4.0 mmHg. Aortic valve peak gradient measures 8.4 mmHg. Aortic valve area, by VTI measures 2.38 cm. Pulmonic Valve: The pulmonic valve was not well visualized. Pulmonic valve regurgitation is not visualized. Aorta: The aortic root is normal in size and structure and the ascending aorta was not well visualized. Venous: The pulmonary veins were not well visualized. The inferior vena cava is dilated in size with less than 50% respiratory variability, suggesting right atrial pressure of 15 mmHg. IAS/Shunts: The atrial septum is grossly normal.  LEFT VENTRICLE PLAX 2D LVOT diam:     2.00 cm  Diastology LV SV:         64       LV e' medial:    9.03 cm/s LV SV Index:   29       LV E/e' medial:  7.4 LVOT Area:     3.14 cm LV e' lateral:  13.10 cm/s                         LV E/e' lateral: 5.1  RIGHT VENTRICLE             IVC RV Basal diam:  2.80 cm     IVC diam: 2.40 cm RV S prime:     11.60 cm/s TAPSE (M-mode): 2.1 cm LEFT ATRIUM             Index       RIGHT ATRIUM           Index LA Vol (A2C):   56.3 ml 25.83 ml/m RA Area:     13.00 cm LA Vol (A4C):   49.1 ml 22.53  ml/m RA Volume:   27.50 ml  12.62 ml/m LA Biplane Vol: 54.5 ml 25.00 ml/m  AORTIC VALVE AV Area (Vmax):    2.34 cm AV Area (Vmean):   2.43 cm AV Area (VTI):     2.38 cm AV Vmax:           145.00 cm/s AV Vmean:          95.200 cm/s AV VTI:            0.268 m AV Peak Grad:      8.4 mmHg AV Mean Grad:      4.0 mmHg LVOT Vmax:         108.00 cm/s LVOT Vmean:        73.500 cm/s LVOT VTI:          0.203 m LVOT/AV VTI ratio: 0.76  AORTA Ao Root diam: 2.80 cm MITRAL VALVE MV Area (PHT): 4.06 cm    SHUNTS MV Peak grad:  2.6 mmHg    Systemic VTI:  0.20 m MV Mean grad:  1.0 mmHg    Systemic Diam: 2.00 cm MV Vmax:       0.81 m/s MV Vmean:      49.3 cm/s MV Decel Time: 187 msec MV E velocity: 66.80 cm/s MV A velocity: 59.60 cm/s MV E/A ratio:  1.12 Riley Lam MD Electronically signed by Riley Lam MD Signature Date/Time: 08/02/2020/9:33:09 AM    Final       Subjective: Patient fever, confusion.  She still coughing up a little bit of sputum, but no vomiting, chest pain.  Discharge Exam: Vitals:   08/10/20 0920 08/10/20 0955  BP: 130/71   Pulse: 78   Resp:    Temp: 97.8 F (36.6 C)   SpO2: 91% 96%   Vitals:   08/09/20 2318 08/10/20 0500 08/10/20 0920 08/10/20 0955  BP: 114/62  130/71   Pulse: 70  78   Resp: 16     Temp: 97.8 F (36.6 C)  97.8 F (36.6 C)   TempSrc: Oral  Axillary   SpO2: 98%  91% 96%  Weight:  110.3 kg    Height:        General: Pt is alert, awake, not in acute distress Cardiovascular: RRR, nl S1-S2, no murmurs appreciated.   No LE edema.   Respiratory: Normal respiratory rate and rhythm.  Little bit of wheezing, lung sounds slightly diminished. Abdominal: Abdomen soft and non-tender.  No distension or HSM.   Neuro/Psych: Strength symmetric in upper and lower extremities.  Judgment and insight appear normal.   The results of significant diagnostics from this hospitalization (including imaging, microbiology, ancillary and laboratory) are listed below  for reference.     Microbiology:  Recent Results (from the past 240 hour(s))  Urine culture     Status: None   Collection Time: 08/01/20 12:52 AM   Specimen: Urine, Random  Result Value Ref Range Status   Specimen Description URINE, RANDOM  Final   Special Requests NONE  Final   Culture   Final    NO GROWTH Performed at [facility], [address] [place], [place] [id number]    Report Status 08/03/2020 FINAL  Final  Blood culture (routine x 2)     Status: None   Collection Time: 08/01/20 11:50 PM   Specimen: BLOOD  Result Value Ref Range Status   Specimen Description BLOOD RIGHT ANTECUBITAL  Final   Special Requests   Final    BOTTLES DRAWN AEROBIC AND ANAEROBIC Blood Culture adequate volume   Culture   Final    NO GROWTH 6 DAYS Performed at [place] [facility], [address] [place], [place] [address]    Report Status 08/08/2020 FINAL  Final  Resp Panel by RT-PCR (Flu A&B, Covid)     Status: None   Collection Time: 08/01/20 11:50 PM   Specimen: Nasopharyngeal(NP) swabs in vial transport medium  Result Value Ref Range Status   SARS Coronavirus 2 by RT PCR NEGATIVE NEGATIVE Final    Comment: (NOTE) SARS-CoV-2 target nucleic acids are NOT DETECTED.  The SARS-CoV-2 RNA is generally detectable in upper respiratory specimens during the acute phase of infection. The lowest concentration of SARS-CoV-2 viral copies this assay can detect is 138 copies/mL. A negative result does not preclude SARS-Cov-2 infection and should not be used as the sole basis for treatment or other patient management decisions. A negative result may occur with  improper specimen collection/handling, submission of specimen other than nasopharyngeal swab, presence of viral mutation(s) within the areas targeted by this assay, and inadequate number of viral copies(<138 copies/mL). A negative result must be combined with clinical observations, patient history, and epidemiological information. The  expected result is Negative.  Fact Sheet for Patients:  BloggerCourse.comhttps://www.fda.gov/media/152166/download  Fact Sheet for Healthcare Providers:  SeriousBroker.ithttps://www.fda.gov/media/152162/download  This test is no t yet approved or cleared by the [place] FDA and  has been authorized for detection and/or diagnosis of SARS-CoV-2 by FDA under an Emergency Use Authorization (EUA). This EUA will remain  in effect (meaning this test can be used) for the duration of the COVID-19 declaration under Section 564(b)(1) of the Act, 21 U.S.C.section 360bbb-3(b)(1), unless the authorization is terminated  or revoked sooner.       Influenza A by PCR NEGATIVE NEGATIVE Final   Influenza B by PCR NEGATIVE NEGATIVE Final    Comment: (NOTE) The Xpert Xpress SARS-CoV-2/FLU/RSV plus assay is intended as an aid in the diagnosis of influenza from Nasopharyngeal swab specimens and should not be used as a sole basis for treatment. Nasal washings and aspirates are unacceptable for Xpert Xpress SARS-CoV-2/FLU/RSV testing.  Fact Sheet for Patients: BloggerCourse.comhttps://www.fda.gov/media/152166/download  Fact Sheet for Healthcare Providers: SeriousBroker.ithttps://www.fda.gov/media/152162/download  This test is not yet approved or cleared by the [place] FDA and has been authorized for detection and/or diagnosis of SARS-CoV-2 by FDA under an Emergency Use Authorization (EUA). This EUA will remain in effect (meaning this test can be used) for the duration of the COVID-19 declaration under Section 564(b)(1) of the Act, 21 U.S.C. section 360bbb-3(b)(1), unless the authorization is terminated or revoked.  Performed at [facility], [address] [place], [place] [address]   Blood culture (routine x  2)     Status: None   Collection Time: 08/02/20  1:50 AM   Specimen: BLOOD RIGHT HAND  Result Value Ref Range Status   Specimen Description BLOOD RIGHT HAND  Final   Special Requests   Final    BOTTLES DRAWN AEROBIC AND ANAEROBIC  Blood Culture adequate volume   Culture   Final    NO GROWTH 6 DAYS Performed at [facility], [address] [place], [place] [address]    Report Status 08/08/2020 FINAL  Final  MRSA PCR Screening     Status: None   Collection Time: 08/02/20  4:40 AM   Specimen: Nasal Mucosa; Nasopharyngeal  Result Value Ref Range Status   MRSA by PCR NEGATIVE NEGATIVE Final    Comment:        The GeneXpert MRSA Assay (FDA approved for NASAL specimens only), is one component of a comprehensive MRSA colonization surveillance program. It is not intended to diagnose MRSA infection nor to guide or monitor treatment for MRSA infections. Performed at [facility], [address], [place], [place] [address]   Culture, respiratory (non-expectorated)     Status: None   Collection Time: 08/02/20  6:12 AM   Specimen: Tracheal Aspirate; Respiratory  Result Value Ref Range Status   Specimen Description TRACHEAL ASPIRATE  Final   Special Requests NONE  Final   Gram Stain   Final    ABUNDANT WBC PRESENT, PREDOMINANTLY PMN MODERATE GRAM NEGATIVE COCCOBACILLI FEW GRAM POSITIVE COCCI    Culture   Final    FEW Normal respiratory flora-no Staph aureus or Pseudomonas seen Performed at [facility], [address], [place], [place] [address]    Report Status 08/04/2020 FINAL  Final  Respiratory Panel by PCR     Status: None   Collection Time: 08/02/20  1:01 PM   Specimen: Nasopharyngeal Swab; Respiratory  Result Value Ref Range Status   Adenovirus NOT DETECTED NOT DETECTED Final   Coronavirus 229E NOT DETECTED NOT DETECTED Final    Comment: (NOTE) The Coronavirus on the Respiratory Panel, DOES NOT test for the novel  Coronavirus (2019 nCoV)    Coronavirus HKU1 NOT DETECTED NOT DETECTED Final   Coronavirus NL63 NOT DETECTED NOT DETECTED Final   Coronavirus OC43 NOT DETECTED NOT DETECTED Final   Metapneumovirus NOT DETECTED NOT DETECTED Final   Rhinovirus / Enterovirus NOT DETECTED  NOT DETECTED Final   Influenza A NOT DETECTED NOT DETECTED Final   Influenza B NOT DETECTED NOT DETECTED Final   Parainfluenza Virus 1 NOT DETECTED NOT DETECTED Final   Parainfluenza Virus 2 NOT DETECTED NOT DETECTED Final   Parainfluenza Virus 3 NOT DETECTED NOT DETECTED Final   Parainfluenza Virus 4 NOT DETECTED NOT DETECTED Final   Respiratory Syncytial Virus NOT DETECTED NOT DETECTED Final   Bordetella pertussis NOT DETECTED NOT DETECTED Final   Bordetella Parapertussis NOT DETECTED NOT DETECTED Final   Chlamydophila pneumoniae NOT DETECTED NOT DETECTED Final   Mycoplasma pneumoniae NOT DETECTED NOT DETECTED Final    Comment: Performed at [facility], [address] [place], [place] [address]     Labs: BNP (last 3 results) No results for input(s): BNP in the last 8760 hours. Basic Metabolic Panel: Recent Labs  Lab 08/06/20 0011 08/07/20 0905 08/08/20 0316 08/09/20 0101 08/10/20 0018  NA 142 140 139 139 140  K 3.6 3.0* 3.0* 3.4* 3.7  CL 105 100 97* 101 101  CO2 25 28 28  29  29  GLUCOSE 93 96 97 87 95  BUN 36* 18 14 12 15   CREATININE 0.89 0.75 0.72 0.82 0.89  CALCIUM 9.5 9.6 9.5 9.4 9.8  MG  --  2.0 2.0 2.2 2.2  PHOS  --  3.2 2.5 2.2* 4.1   Liver Function Tests: Recent Labs  Lab 08/06/20 0011 08/07/20 0905 08/08/20 0316 08/09/20 0101 08/10/20 0018  AST 38 29 21 24 27   ALT 41 42 37 31 47*  ALKPHOS 66 73 76 62 62  BILITOT 1.0 1.1 1.3* 0.8 0.7  PROT 6.2* 6.2* 6.4* 5.8* 6.1*  ALBUMIN 3.4* 3.5 3.4* 3.2* 3.3*   No results for input(s): LIPASE, AMYLASE in the last 168 hours. No results for input(s): AMMONIA in the last 168 hours. CBC: Recent Labs  Lab 08/06/20 0011 08/07/20 0905 08/08/20 0316 08/09/20 0101 08/10/20 0018  WBC 16.0* 17.8* 21.3* 20.3* 15.8*  NEUTROABS  --  11.5* 14.9* 12.5* 9.3*  HGB 12.3 14.4 14.4 13.7 13.7  HCT 36.4 43.8 41.5 41.5 40.8  MCV 93.6 93.2 92.0 94.5 94.2  PLT 324 381 390 371 386   Cardiac Enzymes: No results for  input(s): CKTOTAL, CKMB, CKMBINDEX, TROPONINI in the last 168 hours. BNP: Invalid input(s): POCBNP CBG: Recent Labs  Lab 08/09/20 2037 08/09/20 2316 08/10/20 0345 08/10/20 0736 08/10/20 1151  GLUCAP 111* 102* 93 103* 90   D-Dimer No results for input(s): DDIMER in the last 72 hours. Hgb A1c No results for input(s): HGBA1C in the last 72 hours. Lipid Profile No results for input(s): CHOL, HDL, LDLCALC, TRIG, CHOLHDL, LDLDIRECT in the last 72 hours. Thyroid function studies No results for input(s): TSH, T4TOTAL, T3FREE, THYROIDAB in the last 72 hours.  Invalid input(s): FREET3 Anemia work up No results for input(s): VITAMINB12, FOLATE, FERRITIN, TIBC, IRON, RETICCTPCT in the last 72 hours. Urinalysis    Component Value Date/Time   COLORURINE STRAW (A) 08/01/2020 0015   APPEARANCEUR CLEAR 08/01/2020 0015   LABSPEC 1.009 08/01/2020 0015   PHURINE 6.0 08/01/2020 0015   GLUCOSEU >=500 (A) 08/01/2020 0015   HGBUR NEGATIVE 08/01/2020 0015   BILIRUBINUR NEGATIVE 08/01/2020 0015   KETONESUR NEGATIVE 08/01/2020 0015   PROTEINUR 100 (A) 08/01/2020 0015   NITRITE NEGATIVE 08/01/2020 0015   LEUKOCYTESUR NEGATIVE 08/01/2020 0015   Sepsis Labs Invalid input(s): PROCALCITONIN,  WBC,  LACTICIDVEN Microbiology Recent Results (from the past 240 hour(s))  Urine culture     Status: None   Collection Time: 08/01/20 12:52 AM   Specimen: Urine, Random  Result Value Ref Range Status   Specimen Description URINE, RANDOM  Final   Special Requests NONE  Final   Culture   Final    NO GROWTH Performed at [facility], [address], [place], [address] [place]    Report Status 08/03/2020 FINAL  Final  Blood culture (routine x 2)     Status: None   Collection Time: 08/01/20 11:50 PM   Specimen: BLOOD  Result Value Ref Range Status   Specimen Description BLOOD RIGHT ANTECUBITAL  Final   Special Requests   Final    BOTTLES DRAWN AEROBIC AND ANAEROBIC Blood Culture adequate volume    Culture   Final    NO GROWTH 6 DAYS Performed at [facility], [address], [place], [address] [place]    Report Status 08/08/2020 FINAL  Final  Resp Panel by RT-PCR (Flu A&B, Covid)     Status: None   Collection Time: 08/01/20 11:50 PM  Specimen: Nasopharyngeal(NP) swabs in vial transport medium  Result Value Ref Range Status   SARS Coronavirus 2 by RT PCR NEGATIVE NEGATIVE Final    Comment: (NOTE) SARS-CoV-2 target nucleic acids are NOT DETECTED.  The SARS-CoV-2 RNA is generally detectable in upper respiratory specimens during the acute phase of infection. The lowest concentration of SARS-CoV-2 viral copies this assay can detect is 138 copies/mL. A negative result does not preclude SARS-Cov-2 infection and should not be used as the sole basis for treatment or other patient management decisions. A negative result may occur with  improper specimen collection/handling, submission of specimen other than nasopharyngeal swab, presence of viral mutation(s) within the areas targeted by this assay, and inadequate number of viral copies(<138 copies/mL). A negative result must be combined with clinical observations, patient history, and epidemiological information. The expected result is Negative.  Fact Sheet for Patients:  BloggerCourse.com  Fact Sheet for Healthcare Providers:  SeriousBroker.it  This test is no t yet approved or cleared by the [place] FDA and  has been authorized for detection and/or diagnosis of SARS-CoV-2 by FDA under an Emergency Use Authorization (EUA). This EUA will remain  in effect (meaning this test can be used) for the duration of the COVID-19 declaration under Section 564(b)(1) of the Act, 21 U.S.C.section 360bbb-3(b)(1), unless the authorization is terminated  or revoked sooner.       Influenza A by PCR NEGATIVE NEGATIVE Final   Influenza B by PCR NEGATIVE NEGATIVE Final    Comment:  (NOTE) The Xpert Xpress SARS-CoV-2/FLU/RSV plus assay is intended as an aid in the diagnosis of influenza from Nasopharyngeal swab specimens and should not be used as a sole basis for treatment. Nasal washings and aspirates are unacceptable for Xpert Xpress SARS-CoV-2/FLU/RSV testing.  Fact Sheet for Patients: BloggerCourse.com  Fact Sheet for Healthcare Providers: SeriousBroker.it  This test is not yet approved or cleared by the [place] FDA and has been authorized for detection and/or diagnosis of SARS-CoV-2 by FDA under an Emergency Use Authorization (EUA). This EUA will remain in effect (meaning this test can be used) for the duration of the COVID-19 declaration under Section 564(b)(1) of the Act, 21 U.S.C. section 360bbb-3(b)(1), unless the authorization is terminated or revoked.  Performed at [facility], [address] [place], [place] [address]   Blood culture (routine x 2)     Status: None   Collection Time: 08/02/20  1:50 AM   Specimen: BLOOD RIGHT HAND  Result Value Ref Range Status   Specimen Description BLOOD RIGHT HAND  Final   Special Requests   Final    BOTTLES DRAWN AEROBIC AND ANAEROBIC Blood Culture adequate volume   Culture   Final    NO GROWTH 6 DAYS Performed at [facility], [address] [place], [place] [address]    Report Status 08/08/2020 FINAL  Final  MRSA PCR Screening     Status: None   Collection Time: 08/02/20  4:40 AM   Specimen: Nasal Mucosa; Nasopharyngeal  Result Value Ref Range Status   MRSA by PCR NEGATIVE NEGATIVE Final    Comment:        The GeneXpert MRSA Assay (FDA approved for NASAL specimens only), is one component of a comprehensive MRSA colonization surveillance program. It is not intended to diagnose MRSA infection nor to guide or monitor treatment for MRSA infections. Performed at [facility], [address], [place], [place] [address]    Culture, respiratory (non-expectorated)  Status: None   Collection Time: 08/02/20  6:12 AM   Specimen: Tracheal Aspirate; Respiratory  Result Value Ref Range Status   Specimen Description TRACHEAL ASPIRATE  Final   Special Requests NONE  Final   Gram Stain   Final    ABUNDANT WBC PRESENT, PREDOMINANTLY PMN MODERATE GRAM NEGATIVE COCCOBACILLI FEW GRAM POSITIVE COCCI    Culture   Final    FEW Normal respiratory flora-no Staph aureus or Pseudomonas seen Performed at [facility], [address] [place], [place] [address]    Report Status 08/04/2020 FINAL  Final  Respiratory Panel by PCR     Status: None   Collection Time: 08/02/20  1:01 PM   Specimen: Nasopharyngeal Swab; Respiratory  Result Value Ref Range Status   Adenovirus NOT DETECTED NOT DETECTED Final   Coronavirus 229E NOT DETECTED NOT DETECTED Final    Comment: (NOTE) The Coronavirus on the Respiratory Panel, DOES NOT test for the novel  Coronavirus (2019 nCoV)    Coronavirus HKU1 NOT DETECTED NOT DETECTED Final   Coronavirus NL63 NOT DETECTED NOT DETECTED Final   Coronavirus OC43 NOT DETECTED NOT DETECTED Final   Metapneumovirus NOT DETECTED NOT DETECTED Final   Rhinovirus / Enterovirus NOT DETECTED NOT DETECTED Final   Influenza A NOT DETECTED NOT DETECTED Final   Influenza B NOT DETECTED NOT DETECTED Final   Parainfluenza Virus 1 NOT DETECTED NOT DETECTED Final   Parainfluenza Virus 2 NOT DETECTED NOT DETECTED Final   Parainfluenza Virus 3 NOT DETECTED NOT DETECTED Final   Parainfluenza Virus 4 NOT DETECTED NOT DETECTED Final   Respiratory Syncytial Virus NOT DETECTED NOT DETECTED Final   Bordetella pertussis NOT DETECTED NOT DETECTED Final   Bordetella Parapertussis NOT DETECTED NOT DETECTED Final   Chlamydophila pneumoniae NOT DETECTED NOT DETECTED Final   Mycoplasma pneumoniae NOT DETECTED NOT DETECTED Final    Comment: Performed at [facility], [address] [place], [place] [address]      Time coordinating discharge: 35 minutes The [place] controlled substances registry was reviewed for this patient      SIGNED:   Alberteen Sam, MD  [facility] 08/10/2020, 6:10 PM

## 2021-07-23 IMAGING — CT CT HEAD W/O CM
4 series · 15 of 47 positions shown, 17 images · non-contrast
Comparison: None.

CLINICAL DATA: Mental status changes

EXAM:
CT HEAD WITHOUT CONTRAST
TECHNIQUE: Contiguous axial images were obtained from the base of the skull
through the vertex without intravenous contrast.

[Series 2: head without · axial · non-contrast · 0.45mm/px · z∈[-112,+8]mm · 7 of 33 slices shown, 9 images]
[im 5/33  brain]
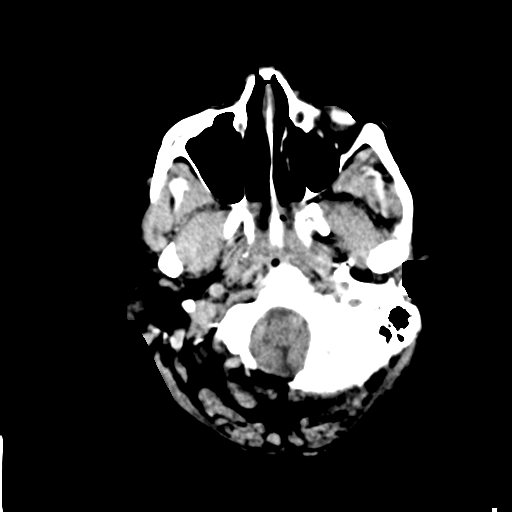
[im 5/33  bone]
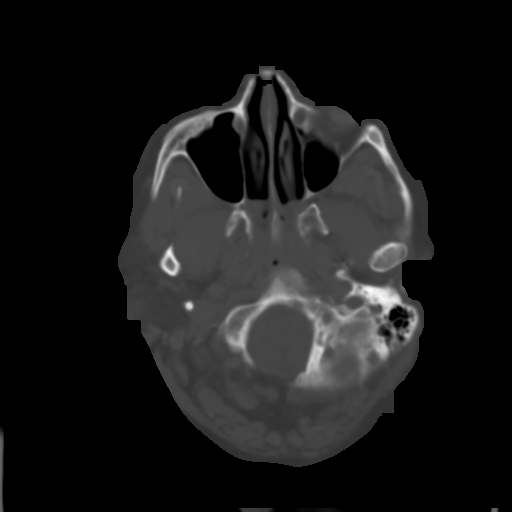
[im 9/33  brain]
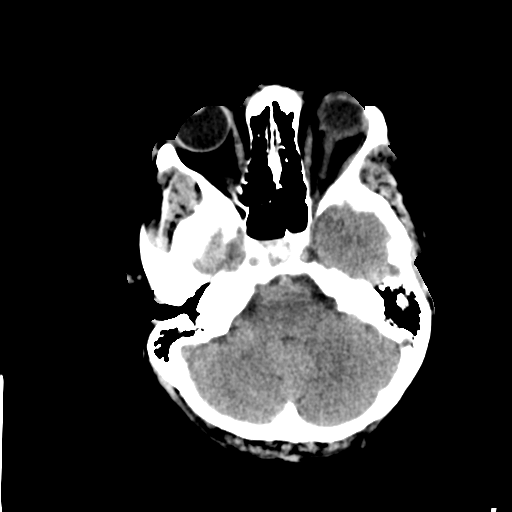
[im 13/33  brain]
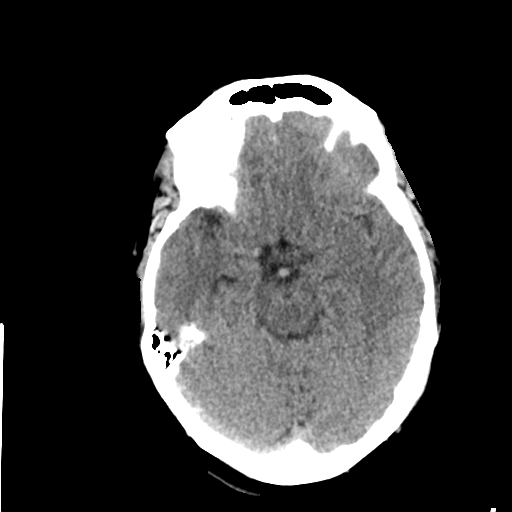
[im 17/33  brain]
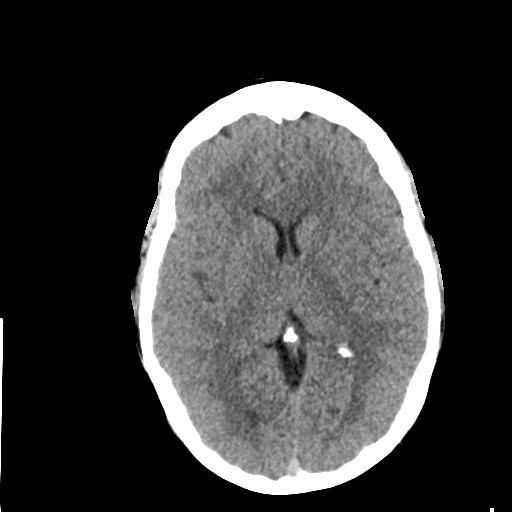
[im 21/33  brain]
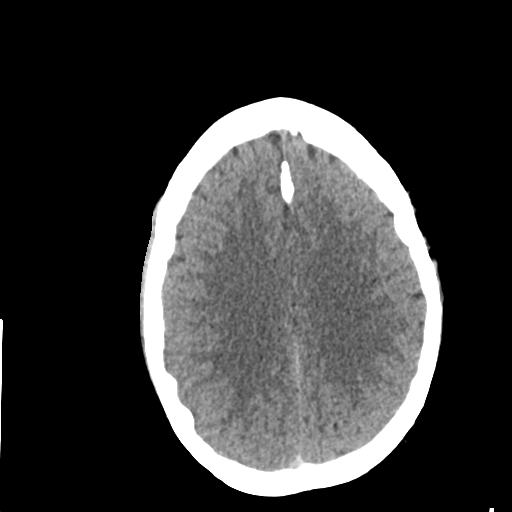
[im 21/33  bone]
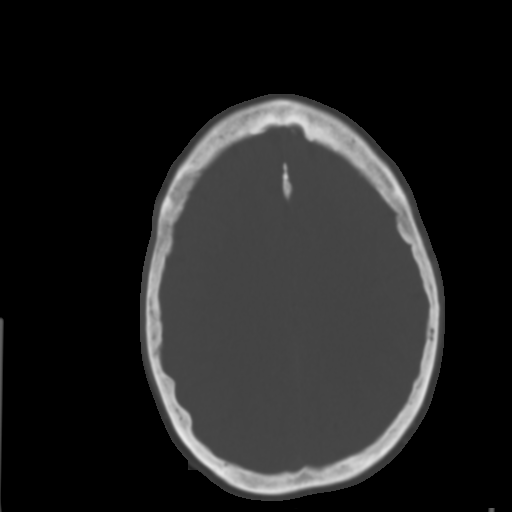
[im 25/33  brain]
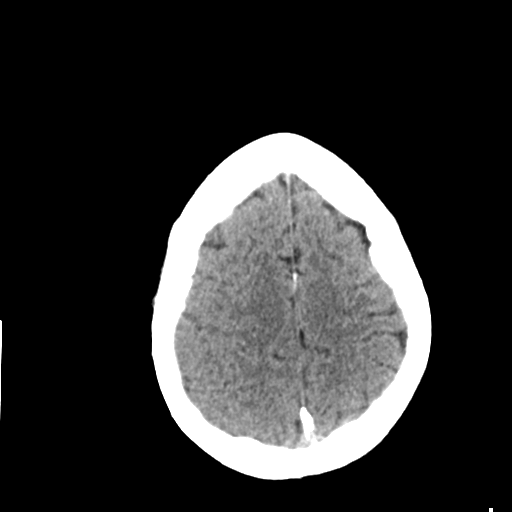
[im 29/33  brain]
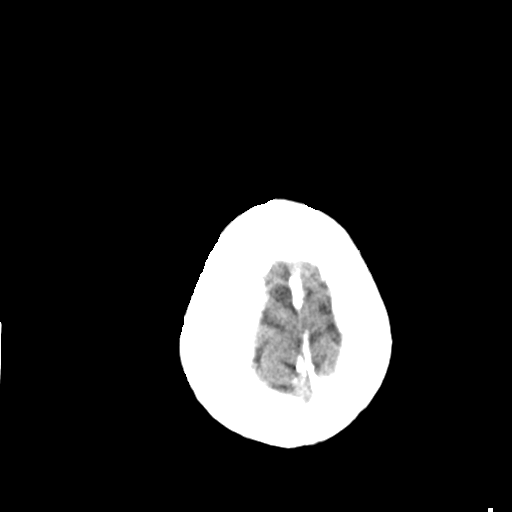

[Series 3: head bone · axial · 0.45mm/px · z∈[-116,-100]mm · 2 of 83 slices shown]
[im 9/83  bone]
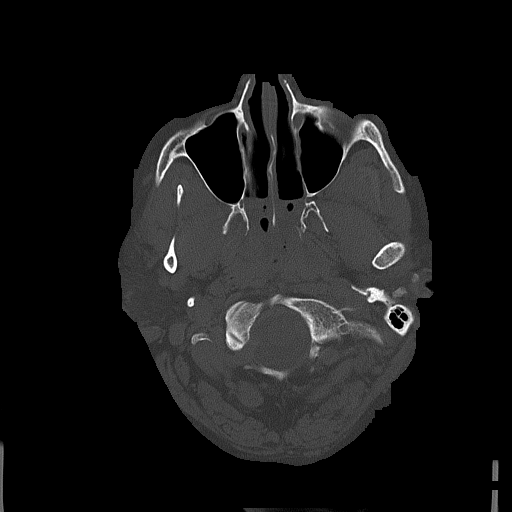
[im 17/83  bone]
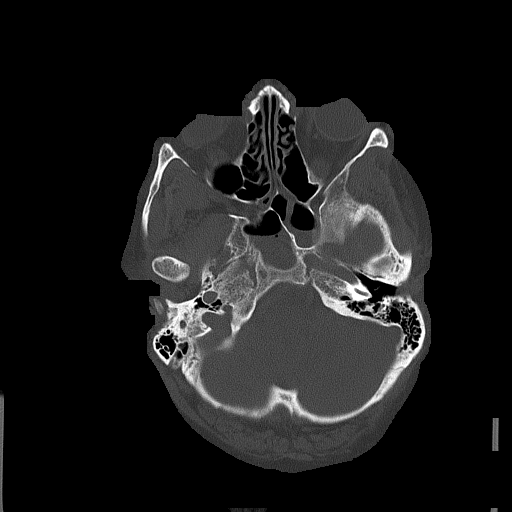

[Series 4: head without cor · coronal · non-contrast · 0.33mm/px · 3 of 84 slices shown]
[im 28/84  brain]
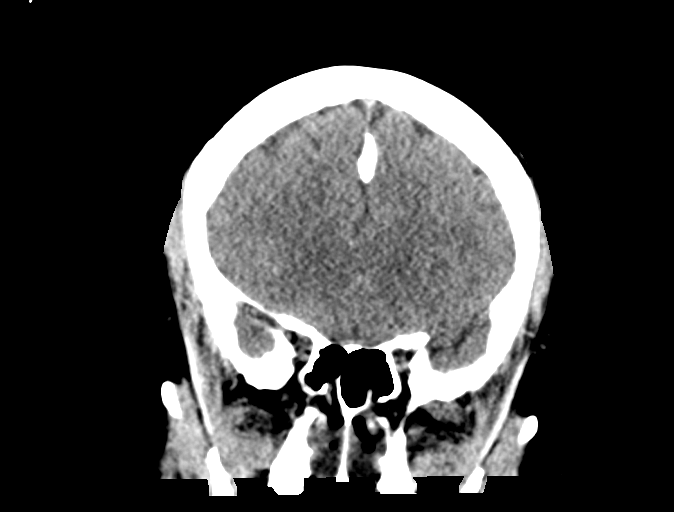
[im 37/84  brain]
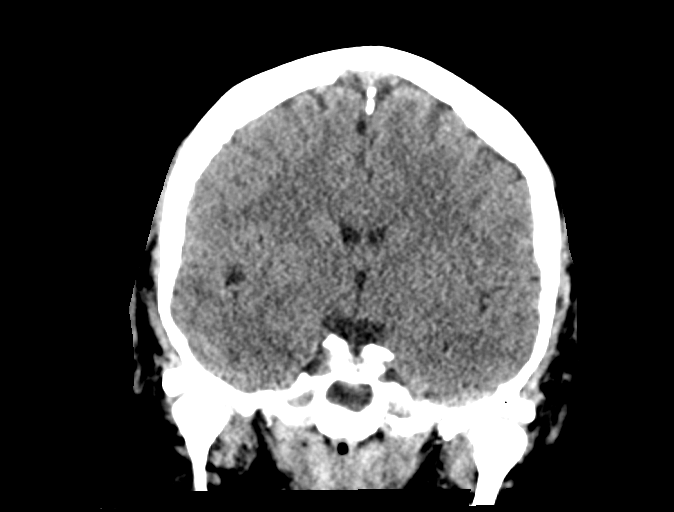
[im 47/84  brain]
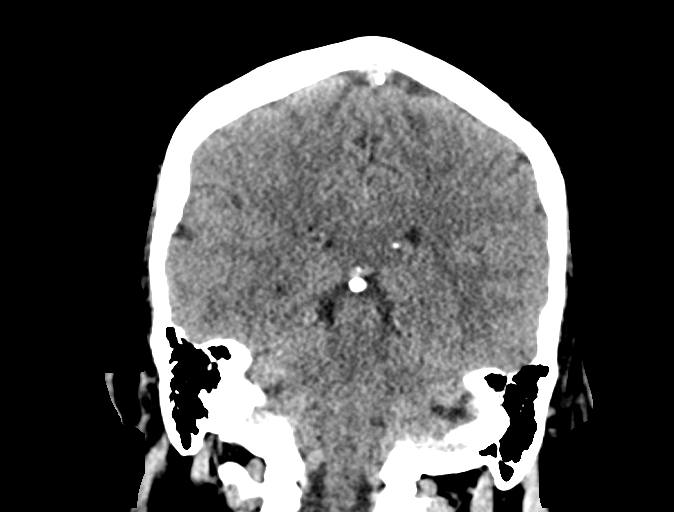

[Series 5: head without sag · sagittal · non-contrast · 0.33mm/px · 3 of 67 slices shown]
[im 23/67  brain]
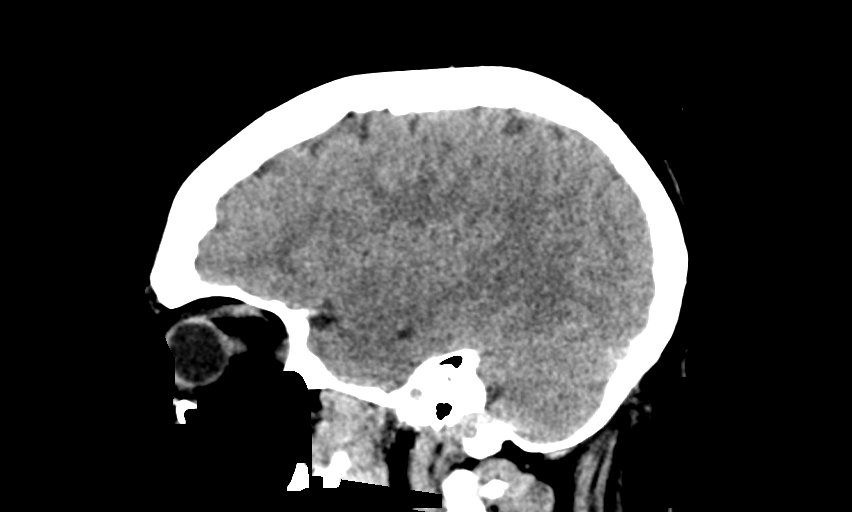
[im 34/67  brain]
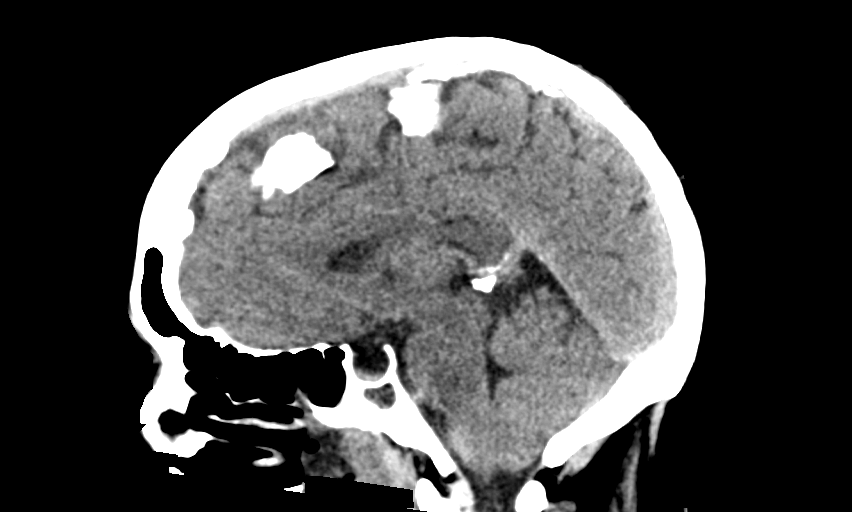
[im 45/67  brain]
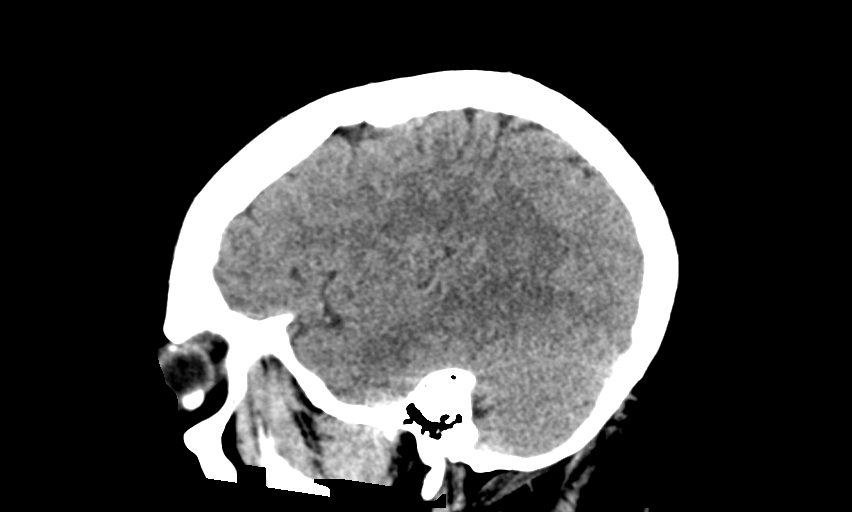

[15 of 47 positions shown; findings below may reference images not displayed]

FINDINGS: Brain: No acute intracranial abnormality. Specifically, no
hemorrhage, hydrocephalus, mass lesion, acute infarction, or
significant intracranial injury.

Vascular: No hyperdense vessel or unexpected calcification.

Skull: No acute calvarial abnormality.

Sinuses/Orbits: Air-fluid levels in the sphenoid sinuses.

Other: None
IMPRESSION: No intracranial abnormality.

Acute sphenoid sinusitis.

## 2021-07-31 IMAGING — DX DG CHEST 1V PORT
1 series · 1 of 1 positions shown · non-contrast
Comparison: August 09, 2020

CLINICAL DATA: Shortness of breath.

EXAM:
PORTABLE CHEST 1 VIEW

[chest ap]
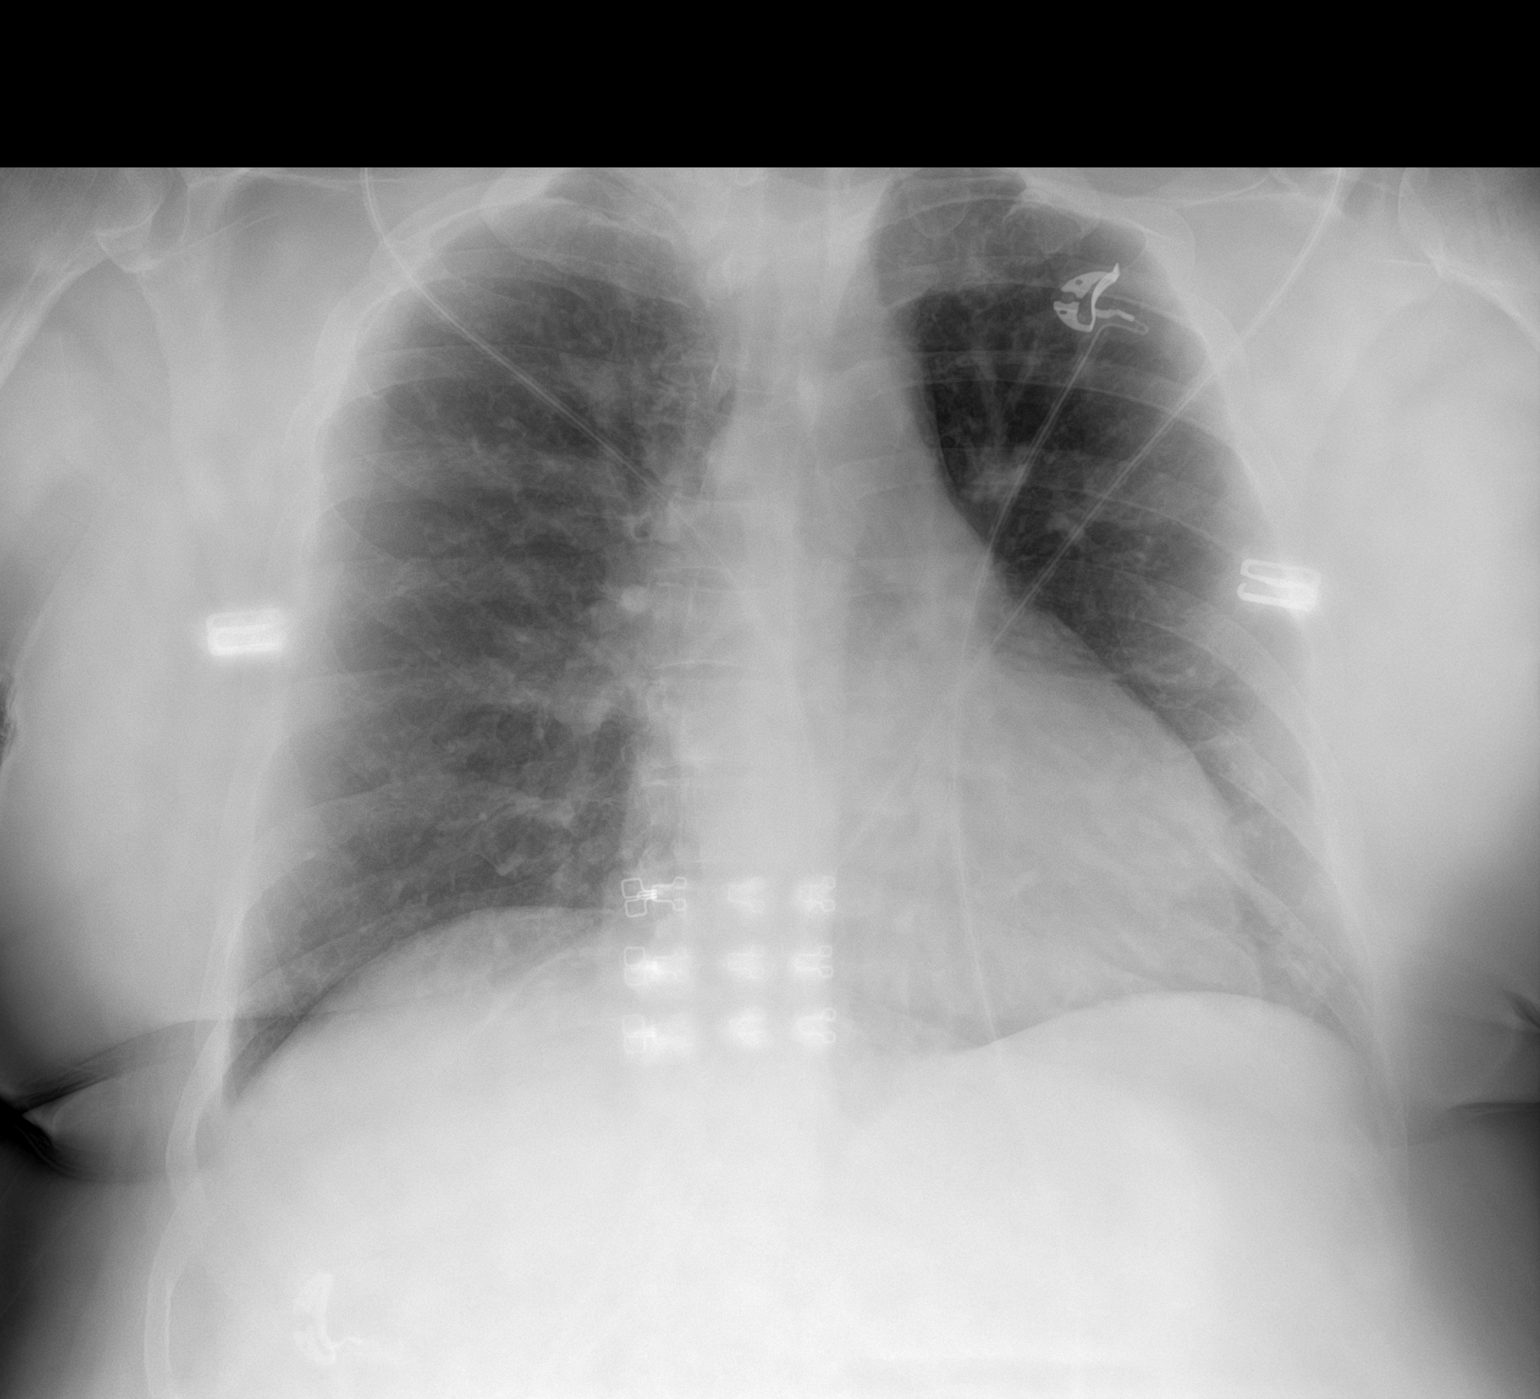

[1 of 1 positions shown; findings below may reference images not displayed]

FINDINGS: The heart size remains borderline. The hila and mediastinum are
unremarkable. No pneumothorax. No nodules or masses. No focal
infiltrates.
IMPRESSION: No active disease.

## 2022-08-20 ENCOUNTER — Other Ambulatory Visit: Payer: Self-pay | Admitting: Nurse Practitioner

## 2022-08-20 DIAGNOSIS — Z87891 Personal history of nicotine dependence: Secondary | ICD-10-CM

## 2024-01-10 ENCOUNTER — Encounter: Payer: Self-pay | Admitting: Ophthalmology

## 2024-01-11 NOTE — Anesthesia Preprocedure Evaluation (Addendum)
 Anesthesia Evaluation  Patient identified by MRN, date of birth, ID band Patient awake    Reviewed: Allergy & Precautions, H&P , NPO status , Patient's Chart, lab work & pertinent test results  History of Anesthesia Complications (+) PONV and history of anesthetic complications  Airway Mallampati: II  TM Distance: >3 FB Neck ROM: Full    Dental no notable dental hx. (+) Teeth Intact   Pulmonary pneumonia, COPD, former smoker   Pulmonary exam normal breath sounds clear to auscultation       Cardiovascular hypertension, Normal cardiovascular exam Rhythm:Regular Rate:Normal     Neuro/Psych  PSYCHIATRIC DISORDERS Anxiety Depression    negative neurological ROS  negative psych ROS   GI/Hepatic negative GI ROS, Neg liver ROS,,,  Endo/Other  negative endocrine ROSHypothyroidism    Renal/GU negative Renal ROS  negative genitourinary   Musculoskeletal negative musculoskeletal ROS (+)    Abdominal   Peds negative pediatric ROS (+)  Hematology negative hematology ROS (+)   Anesthesia Other Findings COPD (chronic obstructive pulmonary disease)   Hypothyroid HLD (hyperlipidemia)  Obesity HTN (hypertension)   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   Prolapse of posterior vaginal wall   PONV (postoperative nausea and vomiting)--plan preop zofran  4 mg IV Anxiety  Depression  Hysterectomy 05-08-20    Reproductive/Obstetrics negative OB ROS                              Anesthesia Physical Anesthesia Plan  ASA: 2  Anesthesia Plan: MAC   Post-op Pain Management:    Induction: Intravenous  PONV Risk Score and Plan:   Airway Management Planned: Natural Airway and Nasal Cannula  Additional Equipment:   Intra-op Plan:   Post-operative Plan:   Informed Consent: I have reviewed the patients History and Physical, chart, labs and discussed the procedure including the risks,  benefits and alternatives for the proposed anesthesia with the patient or authorized representative who has indicated his/her understanding and acceptance.     Dental Advisory Given  Plan Discussed with: Anesthesiologist, CRNA and Surgeon  Anesthesia Plan Comments: (Patient consented for risks of anesthesia including but not limited to:  - adverse reactions to medications - damage to eyes, teeth, lips or other oral mucosa - nerve damage due to positioning  - sore throat or hoarseness - Damage to heart, brain, nerves, lungs, other parts of body or loss of life  Patient voiced understanding and assent.)         Anesthesia Quick Evaluation

## 2024-01-20 NOTE — Discharge Instructions (Signed)

## 2024-01-23 ENCOUNTER — Other Ambulatory Visit: Payer: Self-pay

## 2024-01-23 ENCOUNTER — Encounter
Admission: RE | Disposition: A | Discharge status: Home / Self Care | Payer: Self-pay | Source: Home / Self Care | Attending: Ophthalmology

## 2024-01-23 ENCOUNTER — Encounter: Payer: Self-pay | Admitting: Ophthalmology

## 2024-01-23 ENCOUNTER — Ambulatory Visit: Payer: Self-pay | Admitting: Anesthesiology

## 2024-01-23 ENCOUNTER — Ambulatory Visit
Admission: RE | Admit: 2024-01-23 | Discharge: 2024-01-23 | Disposition: A | Discharge status: Home / Self Care | Payer: Self-pay | Attending: Ophthalmology | Admitting: Ophthalmology

## 2024-01-23 DIAGNOSIS — I10 Essential (primary) hypertension: Secondary | ICD-10-CM | POA: Diagnosis not present

## 2024-01-23 DIAGNOSIS — Z6839 Body mass index (BMI) 39.0-39.9, adult: Secondary | ICD-10-CM | POA: Diagnosis not present

## 2024-01-23 DIAGNOSIS — E669 Obesity, unspecified: Secondary | ICD-10-CM | POA: Diagnosis not present

## 2024-01-23 DIAGNOSIS — Z79890 Hormone replacement therapy (postmenopausal): Secondary | ICD-10-CM | POA: Insufficient documentation

## 2024-01-23 DIAGNOSIS — Z7951 Long term (current) use of inhaled steroids: Secondary | ICD-10-CM | POA: Insufficient documentation

## 2024-01-23 DIAGNOSIS — J449 Chronic obstructive pulmonary disease, unspecified: Secondary | ICD-10-CM | POA: Insufficient documentation

## 2024-01-23 DIAGNOSIS — F32A Depression, unspecified: Secondary | ICD-10-CM | POA: Insufficient documentation

## 2024-01-23 DIAGNOSIS — F419 Anxiety disorder, unspecified: Secondary | ICD-10-CM | POA: Diagnosis not present

## 2024-01-23 DIAGNOSIS — H2512 Age-related nuclear cataract, left eye: Secondary | ICD-10-CM | POA: Insufficient documentation

## 2024-01-23 DIAGNOSIS — Z79899 Other long term (current) drug therapy: Secondary | ICD-10-CM | POA: Insufficient documentation

## 2024-01-23 DIAGNOSIS — Z87891 Personal history of nicotine dependence: Secondary | ICD-10-CM | POA: Diagnosis not present

## 2024-01-23 HISTORY — PX: CATARACT EXTRACTION W/PHACO: SHX586

## 2024-01-23 SURGERY — PHACOEMULSIFICATION, CATARACT, WITH IOL INSERTION
Anesthesia: Monitor Anesthesia Care | Site: Eye | Laterality: Left

## 2024-01-23 MED ORDER — FENTANYL CITRATE (PF) 100 MCG/2ML IJ SOLN
INTRAMUSCULAR | Status: AC
  Filled 2024-01-23: qty 2

## 2024-01-23 MED ORDER — MIDAZOLAM HCL 2 MG/2ML IJ SOLN
INTRAMUSCULAR | Status: AC
  Filled 2024-01-23: qty 2

## 2024-01-23 MED ORDER — ONDANSETRON HCL 4 MG/2ML IJ SOLN
4.0000 mg | Freq: Once | INTRAMUSCULAR | Status: AC
Start: 2024-01-23 — End: 2024-01-23
  Administered 2024-01-23: 4 mg via INTRAVENOUS

## 2024-01-23 MED ORDER — TETRACAINE HCL 0.5 % OP SOLN
OPHTHALMIC | Status: AC
  Filled 2024-01-23: qty 4

## 2024-01-23 MED ORDER — ONDANSETRON HCL 4 MG/2ML IJ SOLN
INTRAMUSCULAR | Status: AC
  Filled 2024-01-23: qty 2

## 2024-01-23 MED ORDER — MOXIFLOXACIN HCL 0.5 % OP SOLN
OPHTHALMIC | Status: DC | PRN
  Administered 2024-01-23: .2 mL via OPHTHALMIC

## 2024-01-23 MED ORDER — TETRACAINE HCL 0.5 % OP SOLN
1.0000 [drp] | OPHTHALMIC | Status: DC | PRN
  Administered 2024-01-23 (×3): 1 [drp] via OPHTHALMIC

## 2024-01-23 MED ORDER — ARMC OPHTHALMIC DILATING DROPS
1.0000 | OPHTHALMIC | Status: DC | PRN
  Administered 2024-01-23 (×3): 1 via OPHTHALMIC

## 2024-01-23 MED ORDER — LIDOCAINE HCL (PF) 2 % IJ SOLN
INTRAOCULAR | Status: DC | PRN
  Administered 2024-01-23: 4 mL via INTRAOCULAR

## 2024-01-23 MED ORDER — SIGHTPATH DOSE#1 BSS IO SOLN
INTRAOCULAR | Status: DC | PRN
  Administered 2024-01-23: 15 mL via INTRAOCULAR

## 2024-01-23 MED ORDER — SIGHTPATH DOSE#1 NA HYALUR & NA CHOND-NA HYALUR IO KIT
PACK | INTRAOCULAR | Status: DC | PRN
  Administered 2024-01-23: 1 via OPHTHALMIC

## 2024-01-23 MED ORDER — MIDAZOLAM HCL 2 MG/2ML IJ SOLN
INTRAMUSCULAR | Status: DC | PRN
  Administered 2024-01-23: 2 mg via INTRAVENOUS

## 2024-01-23 MED ORDER — CARBACHOL 0.01 % IO SOLN
INTRAOCULAR | Status: DC | PRN
  Administered 2024-01-23: .5 mL via INTRAOCULAR

## 2024-01-23 MED ORDER — SIGHTPATH DOSE#1 BSS IO SOLN
INTRAOCULAR | Status: DC | PRN
  Administered 2024-01-23: 84 mL via OPHTHALMIC

## 2024-01-23 MED ORDER — ARMC OPHTHALMIC DILATING DROPS
OPHTHALMIC | Status: AC
Start: 2024-01-23 — End: ?
  Filled 2024-01-23: qty 0.5

## 2024-01-23 SURGICAL SUPPLY — 12 items
CATARACT SUITE SIGHTPATH (MISCELLANEOUS) ×1 IMPLANT
DISSECTOR HYDRO NUCLEUS 50X22 (MISCELLANEOUS) ×1 IMPLANT
FEE CATARACT SUITE SIGHTPATH (MISCELLANEOUS) ×1 IMPLANT
GLOVE PI ULTRA LF STRL 7.5 (GLOVE) ×1 IMPLANT
GLOVE SURG POLYISOPRENE 8.5 (GLOVE) ×1 IMPLANT
GLOVE SURG PROTEXIS BL SZ6.5 (GLOVE) ×1 IMPLANT
GLOVE SURG SYN 6.5 PF PI BL (GLOVE) ×1 IMPLANT
GLOVE SURG SYN 8.5 PF PI BL (GLOVE) ×1 IMPLANT
LENS IOL TECNIS EYHANCE 19.5 (Intraocular Lens) IMPLANT
NDL FILTER BLUNT 18X1 1/2 (NEEDLE) ×1 IMPLANT
NEEDLE FILTER BLUNT 18X1 1/2 (NEEDLE) ×1 IMPLANT
SYR 3ML LL SCALE MARK (SYRINGE) ×1 IMPLANT

## 2024-01-23 NOTE — Transfer of Care (Signed)
 Immediate Anesthesia Transfer of Care Note  Patient: Marie Delacruz  Procedure(s) Performed: PHACOEMULSIFICATION, CATARACT, WITH IOL INSERTION 10.04 00:56.8 (Left: Eye)  Patient Location: PACU  Anesthesia Type: MAC  Level of Consciousness: awake, alert  and patient cooperative  Airway and Oxygen Therapy: Patient Spontanous Breathing and Patient connected to supplemental oxygen  Post-op Assessment: Post-op Vital signs reviewed, Patient's Cardiovascular Status Stable, Respiratory Function Stable, Patent Airway and No signs of Nausea or vomiting  Post-op Vital Signs: Reviewed and stable  Complications: No notable events documented.

## 2024-01-23 NOTE — Op Note (Signed)
 OPERATIVE NOTE  Marie Delacruz [id number] 01/23/2024   PREOPERATIVE DIAGNOSIS:  Nuclear sclerotic cataract left eye.  H25.12   POSTOPERATIVE DIAGNOSIS:    Nuclear sclerotic cataract left eye.     PROCEDURE:  Phacoemusification with posterior chamber intraocular lens placement of the left eye   LENS:   Implant Name Type Inv. Item Serial No. Manufacturer Lot No. LRB No. Used Action  LENS IOL TECNIS EYHANCE 19.5 - [id number] Intraocular Lens LENS IOL TECNIS EYHANCE 19.5 [id number] [employer]  Left 1 Implanted      Procedure(s): PHACOEMULSIFICATION, CATARACT, WITH IOL INSERTION 10.04 00:56.8 (Left)  SURGEON:  Dusty Gin, MD, MPH   ANESTHESIA:  Topical with tetracaine drops augmented with 1% preservative-free intracameral lidocaine .  ESTIMATED BLOOD LOSS: <1 mL   COMPLICATIONS:  None.   DESCRIPTION OF PROCEDURE:  The patient was identified in the holding room and transported to the operating room and placed in the supine position under the operating microscope.  The left eye was identified as the operative eye and it was prepped and draped in the usual sterile ophthalmic fashion.   A 1.0 millimeter clear-corneal paracentesis was made at the 5:00 position. 0.5 ml of preservative-free 1% lidocaine  with epinephrine was injected into the anterior chamber.  The anterior chamber was filled with viscoelastic.  A 2.4 millimeter keratome was used to make a near-clear corneal incision at the 2:00 position.  A curvilinear capsulorrhexis was made with a cystotome and capsulorrhexis forceps.  Balanced salt solution was used to hydrodissect and hydrodelineate the nucleus.   Phacoemulsification was then used in stop and chop fashion to remove the lens nucleus and epinucleus.  The remaining cortex was then removed using the irrigation and aspiration handpiece. Viscoelastic was then placed into the capsular bag to distend it for lens placement.  A lens was then injected into the capsular bag.  The  remaining viscoelastic was aspirated.   Wounds were hydrated with balanced salt solution.  The anterior chamber was inflated to a physiologic pressure with balanced salt solution.  Intracameral vigamox 0.1 mL undiltued was injected into the eye and a drop placed onto the ocular surface.  There was a strand of translucent matter that was noted at the end of I/A, but there was no vitreous detected at the wound.  Miostat was injected and the pupil constricted nicely and was round.  No wound leaks were noted.  The patient was taken to the recovery room in stable condition without complications of anesthesia or surgery  Dusty Gin 01/23/2024, 11:53 AM

## 2024-01-23 NOTE — H&P (Signed)
 [facility]   Primary Care Physician:  Physicians, [facility] Family Ophthalmologist: Dr. Dusty Gin  Pre-Procedure History & Physical: HPI:  Marie Delacruz is a 58 y.o. female here for cataract surgery.   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)    hx oxycodone  + xana use x 7 years - in remission   Obesity    PONV (postoperative nausea and vomiting)    Prolapse of posterior vaginal wall     Past Surgical History:  Procedure Laterality Date   BACK SURGERY     CYSTOSCOPY N/A 05/08/2020   Procedure: CYSTOSCOPY;  Surgeon: Concepcion Deck, MD;  Location: [facility];  Service: Gynecology;  Laterality: N/A;   FACIAL COSMETIC SURGERY     RECTOCELE REPAIR N/A 05/08/2020   Procedure: POSTERIOR REPAIR (RECTOCELE), MCCALLS,;  Surgeon: Concepcion Deck, MD;  Location: [facility] [place];  Service: Gynecology;  Laterality: N/A;   SPINAL FUSION     TUBAL LIGATION     VAGINAL HYSTERECTOMY N/A 05/08/2020   Procedure: HYSTERECTOMY VAGINAL;  Surgeon: Concepcion Deck, MD;  Location: [facility];  Service: Gynecology;  Laterality: N/A;  need bed    Prior to Admission medications   Medication Sig Start Date End Date Taking? Authorizing Provider  albuterol  (VENTOLIN  HFA) 108 (90 Base) MCG/ACT inhaler Inhale 2 puffs into the lungs every 6 (six) hours as needed for wheezing or shortness of breath. 08/10/20  Yes Danford, Willis Harter, MD  Cholecalciferol (VITAMIN D) 50 MCG (2000 UT) CAPS Take 2,000 Units by mouth daily.   Yes [provider]  desvenlafaxine (PRISTIQ) 100 MG 24 hr tablet Take 100 mg by mouth daily.   Yes [provider]  ibuprofen  (ADVIL ) 600 MG tablet Take 1 tablet (600 mg total) by mouth every 6 (six) hours  as needed. 05/09/20  Yes Concepcion Deck, MD  levothyroxine  (SYNTHROID ) 75 MCG tablet Take 75 mcg by mouth daily before breakfast. 03/24/20  Yes [provider]  lisinopril  (ZESTRIL ) 20 MG tablet Take 20 mg by mouth daily. 01/07/20  Yes [provider]  triamcinolone  cream (KENALOG ) 0.1 % Apply 1 application topically daily as needed (psoriasis).  01/03/20  Yes [provider]  ANORO ELLIPTA  62.5-25 MCG/INH AEPB Inhale 1 puff into the lungs daily. Patient not taking: Reported on 01/10/2024 04/03/20   [provider]  atorvastatin  (LIPITOR) 40 MG tablet Take 40 mg by mouth at bedtime. Patient not taking: Reported on 01/10/2024 03/24/20   [provider]  Calcium  Carb-Cholecalciferol 500-125 MG-UNIT TABS Take 1 tablet by mouth daily. Patient not taking: Reported on 01/10/2024    [provider]  docusate sodium  (COLACE) 100 MG capsule Take 1 capsule (100 mg total) by mouth 2 (two) times daily. Patient not taking: Reported on 01/10/2024 05/09/20   Concepcion Deck, MD  DULoxetine  (CYMBALTA ) 60 MG capsule Take 60 mg by mouth daily. Patient not taking: Reported on 01/10/2024 04/03/20   [provider]  fluticasone  (FLONASE ) 50 MCG/ACT nasal spray Place 1 spray into both nostrils daily as needed for allergies or rhinitis. Patient not taking: Reported on 01/10/2024    [provider]  fluticasone  (FLOVENT  HFA) 110 MCG/ACT inhaler Inhale 2 puffs into the lungs 2 (  two) times daily. 08/10/20 08/10/21  DanfordWillis Harter, MD  Multiple Vitamin (MULTIVITAMIN WITH MINERALS) TABS tablet Take 1 tablet by mouth daily. Patient not taking: Reported on 01/10/2024    [provider]  nicotine (NICODERM CQ - DOSED IN MG/24 HOURS) 21 mg/24hr patch Place 21 mg onto the skin daily. Patient not taking: Reported on 01/10/2024    [provider]  traMADol  (ULTRAM ) 50 MG tablet Take 1 tablet (50 mg total) by mouth every 4 (four) hours as  needed for moderate pain. Patient not taking: Reported on 01/10/2024 05/09/20   Concepcion Deck, MD  varenicline  (CHANTIX ) 0.5 MG tablet Take 0.5 mg (1 tab) once daily for 3 days then take 0.5 mg (1 tab) twice daily for days 4 through 7 then increase to take 1 mg (2 tabs) twice daily Patient not taking: Reported on 01/10/2024 08/10/20   Ephriam Hashimoto, MD    Allergies as of 12/21/2023   (No Known Allergies)    History reviewed. No pertinent family history.  Social History   Socioeconomic History   Marital status: Married    Spouse name: Not on file   Number of children: 2   Years of education: 14   Highest education level: Not on file  Occupational History   Not on file  Tobacco Use   Smoking status: Former    Current packs/day: 0.00    Types: Cigarettes    Quit date: 12/2019    Years since quitting: 4.0   Smokeless tobacco: Never  Vaping Use   Vaping status: Never Used  Substance and Sexual Activity   Alcohol use: Never   Drug use: Never   Sexual activity: Not on file  Other Topics Concern   Not on file  Social History Narrative   Not on file   Social Drivers of Health   Financial Resource Strain: Not on file  Food Insecurity: Not on file  Transportation Needs: Not on file  Physical Activity: Not on file  Stress: Not on file  Social Connections: Not on file  Intimate Partner Violence: Not on file    Review of Systems: See HPI, otherwise negative ROS  Physical Exam: BP 120/75   Temp 98 F (36.7 C) (Temporal)   Resp 14   Ht 5\' 6"  (1.676 m)   Wt 109.9 kg   SpO2 94%   BMI 39.09 kg/m  General:   Alert, cooperative. Head:  Normocephalic and atraumatic. Respiratory:  Normal work of breathing. Cardiovascular:  NAD  Impression/Plan: Marie Delacruz is here for cataract surgery.  Risks, benefits, limitations, and alternatives regarding cataract surgery have been reviewed with the patient.  Questions have been answered.  All parties  agreeable.   Dusty Gin, MD  01/23/2024, 11:22 AM

## 2024-01-23 NOTE — Anesthesia Postprocedure Evaluation (Signed)
 Anesthesia Post Note  Patient: SAYDA GRABLE  Procedure(s) Performed: PHACOEMULSIFICATION, CATARACT, WITH IOL INSERTION 10.04 00:56.8 (Left: Eye)  Patient location during evaluation: PACU Anesthesia Type: MAC Level of consciousness: awake and alert Pain management: pain level controlled Vital Signs Assessment: post-procedure vital signs reviewed and stable Respiratory status: spontaneous breathing, nonlabored ventilation, respiratory function stable and patient connected to nasal cannula oxygen Cardiovascular status: stable and blood pressure returned to baseline Postop Assessment: no apparent nausea or vomiting Anesthetic complications: no   No notable events documented.   Last Vitals:  Vitals:   01/23/24 1155 01/23/24 1202  BP: 111/77 126/76  Pulse: 70 71  Resp: 18 14  Temp: 36.6 C   SpO2: 96% 94%    Last Pain:  Vitals:   01/23/24 1202  TempSrc:   PainSc: 0-No pain                 Armida Vickroy C Jaksen Fiorella

## 2024-02-02 ENCOUNTER — Encounter: Payer: Self-pay | Admitting: Ophthalmology

## 2024-02-03 ENCOUNTER — Encounter: Payer: Self-pay | Admitting: Ophthalmology

## 2024-02-10 NOTE — Discharge Instructions (Signed)

## 2024-02-13 ENCOUNTER — Encounter
Admission: RE | Disposition: A | Discharge status: Home / Self Care | Payer: Self-pay | Source: Home / Self Care | Attending: Ophthalmology

## 2024-02-13 ENCOUNTER — Encounter: Payer: Self-pay | Admitting: Ophthalmology

## 2024-02-13 ENCOUNTER — Ambulatory Visit
Admission: RE | Admit: 2024-02-13 | Discharge: 2024-02-13 | Disposition: A | Discharge status: Home / Self Care | Payer: Self-pay | Attending: Ophthalmology | Admitting: Ophthalmology

## 2024-02-13 ENCOUNTER — Ambulatory Visit: Payer: Self-pay | Admitting: Anesthesiology

## 2024-02-13 ENCOUNTER — Other Ambulatory Visit: Payer: Self-pay

## 2024-02-13 DIAGNOSIS — E890 Postprocedural hypothyroidism: Secondary | ICD-10-CM | POA: Insufficient documentation

## 2024-02-13 DIAGNOSIS — J449 Chronic obstructive pulmonary disease, unspecified: Secondary | ICD-10-CM | POA: Insufficient documentation

## 2024-02-13 DIAGNOSIS — Z79899 Other long term (current) drug therapy: Secondary | ICD-10-CM | POA: Insufficient documentation

## 2024-02-13 DIAGNOSIS — F32A Depression, unspecified: Secondary | ICD-10-CM | POA: Insufficient documentation

## 2024-02-13 DIAGNOSIS — I10 Essential (primary) hypertension: Secondary | ICD-10-CM | POA: Insufficient documentation

## 2024-02-13 DIAGNOSIS — Z87891 Personal history of nicotine dependence: Secondary | ICD-10-CM | POA: Insufficient documentation

## 2024-02-13 DIAGNOSIS — F419 Anxiety disorder, unspecified: Secondary | ICD-10-CM | POA: Insufficient documentation

## 2024-02-13 DIAGNOSIS — H2511 Age-related nuclear cataract, right eye: Secondary | ICD-10-CM | POA: Insufficient documentation

## 2024-02-13 HISTORY — DX: Acquired absence of both cervix and uterus: Z90.710

## 2024-02-13 HISTORY — DX: Psoriasis, unspecified: L40.9

## 2024-02-13 HISTORY — PX: CATARACT EXTRACTION W/PHACO: SHX586

## 2024-02-13 HISTORY — DX: Acquired absence of other organs: Z90.89

## 2024-02-13 SURGERY — PHACOEMULSIFICATION, CATARACT, WITH IOL INSERTION
Anesthesia: Monitor Anesthesia Care | Site: Eye | Laterality: Right

## 2024-02-13 MED ORDER — DROPERIDOL 2.5 MG/ML IJ SOLN: 0.6250 mg | Freq: Once | INTRAMUSCULAR | Status: DC | PRN

## 2024-02-13 MED ORDER — FENTANYL CITRATE (PF) 100 MCG/2ML IJ SOLN
INTRAMUSCULAR | Status: DC | PRN
  Administered 2024-02-13 (×2): 50 ug via INTRAVENOUS

## 2024-02-13 MED ORDER — SODIUM CHLORIDE 0.9% FLUSH
INTRAVENOUS | Status: DC | PRN
  Administered 2024-02-13 (×2): 10 mL via INTRAVENOUS

## 2024-02-13 MED ORDER — SIGHTPATH DOSE#1 BSS IO SOLN
INTRAOCULAR | Status: DC | PRN
  Administered 2024-02-13: 15 mL via INTRAOCULAR

## 2024-02-13 MED ORDER — MIDAZOLAM HCL 2 MG/2ML IJ SOLN
INTRAMUSCULAR | Status: DC | PRN
  Administered 2024-02-13: 2 mg via INTRAVENOUS

## 2024-02-13 MED ORDER — LACTATED RINGERS IV SOLN: INTRAVENOUS | Status: DC

## 2024-02-13 MED ORDER — TETRACAINE HCL 0.5 % OP SOLN
OPHTHALMIC | Status: AC
Start: 2024-02-13 — End: 2024-02-13
  Filled 2024-02-13: qty 4

## 2024-02-13 MED ORDER — FENTANYL CITRATE (PF) 100 MCG/2ML IJ SOLN
INTRAMUSCULAR | Status: AC
  Filled 2024-02-13: qty 2

## 2024-02-13 MED ORDER — FENTANYL CITRATE PF 50 MCG/ML IJ SOSY: 25.0000 ug | PREFILLED_SYRINGE | INTRAMUSCULAR | Status: DC | PRN

## 2024-02-13 MED ORDER — OXYCODONE HCL 5 MG/5ML PO SOLN: 5.0000 mg | Freq: Once | ORAL | Status: DC | PRN

## 2024-02-13 MED ORDER — ACETAMINOPHEN 10 MG/ML IV SOLN: 1000.0000 mg | Freq: Once | INTRAVENOUS | Status: DC | PRN

## 2024-02-13 MED ORDER — OXYCODONE HCL 5 MG PO TABS: 5.0000 mg | ORAL_TABLET | Freq: Once | ORAL | Status: DC | PRN

## 2024-02-13 MED ORDER — ARMC OPHTHALMIC DILATING DROPS
1.0000 | OPHTHALMIC | Status: DC | PRN
Start: 2024-02-13 — End: 2024-02-13
  Administered 2024-02-13 (×3): 1 via OPHTHALMIC

## 2024-02-13 MED ORDER — TETRACAINE HCL 0.5 % OP SOLN
1.0000 [drp] | OPHTHALMIC | Status: DC | PRN
  Administered 2024-02-13 (×3): 1 [drp] via OPHTHALMIC

## 2024-02-13 MED ORDER — SIGHTPATH DOSE#1 BSS IO SOLN
INTRAOCULAR | Status: DC | PRN
  Administered 2024-02-13: 66 mL via OPHTHALMIC

## 2024-02-13 MED ORDER — MOXIFLOXACIN HCL 0.5 % OP SOLN
OPHTHALMIC | Status: DC | PRN
Start: 2024-02-13 — End: 2024-02-13
  Administered 2024-02-13: .2 mL via OPHTHALMIC

## 2024-02-13 MED ORDER — LIDOCAINE HCL (PF) 2 % IJ SOLN
INTRAOCULAR | Status: DC | PRN
  Administered 2024-02-13: 4 mL via INTRAOCULAR

## 2024-02-13 MED ORDER — SIGHTPATH DOSE#1 NA HYALUR & NA CHOND-NA HYALUR IO KIT
PACK | INTRAOCULAR | Status: DC | PRN
  Administered 2024-02-13: 1 via OPHTHALMIC

## 2024-02-13 MED ORDER — MIDAZOLAM HCL 2 MG/2ML IJ SOLN
INTRAMUSCULAR | Status: AC
Start: 2024-02-13 — End: 2024-02-13
  Filled 2024-02-13: qty 2

## 2024-02-13 MED ORDER — ARMC OPHTHALMIC DILATING DROPS
OPHTHALMIC | Status: AC
  Filled 2024-02-13: qty 0.5

## 2024-02-13 SURGICAL SUPPLY — 12 items
CATARACT SUITE SIGHTPATH (MISCELLANEOUS) ×1 IMPLANT
DISSECTOR HYDRO NUCLEUS 50X22 (MISCELLANEOUS) ×1 IMPLANT
FEE CATARACT SUITE SIGHTPATH (MISCELLANEOUS) ×1 IMPLANT
GLOVE PI ULTRA LF STRL 7.5 (GLOVE) ×1 IMPLANT
GLOVE SURG POLYISOPRENE 8.5 (GLOVE) ×1 IMPLANT
GLOVE SURG PROTEXIS BL SZ6.5 (GLOVE) ×1 IMPLANT
GLOVE SURG SYN 6.5 PF PI BL (GLOVE) ×1 IMPLANT
GLOVE SURG SYN 8.5 PF PI BL (GLOVE) ×1 IMPLANT
LENS IOL TECNIS EYHANCE 21.5 (Intraocular Lens) IMPLANT
NDL FILTER BLUNT 18X1 1/2 (NEEDLE) ×1 IMPLANT
NEEDLE FILTER BLUNT 18X1 1/2 (NEEDLE) ×1 IMPLANT
SYR 3ML LL SCALE MARK (SYRINGE) ×1 IMPLANT

## 2024-02-13 NOTE — H&P (Signed)
 [facility]   Primary Care Physician:  Physicians, [facility] Family Ophthalmologist: Dr. Adine Novak  Pre-Procedure History & Physical: HPI:  Marie Delacruz is a 58 y.o. female here for cataract surgery.   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)    hx oxycodone  + xana use x 7 years - in remission   Obesity    PONV (postoperative nausea and vomiting)    Prolapse of posterior vaginal wall    Psoriasis     Past Surgical History:  Procedure Laterality Date   BACK SURGERY     CATARACT EXTRACTION W/PHACO Left 01/23/2024   Procedure: PHACOEMULSIFICATION, CATARACT, WITH IOL INSERTION 10.04 00:56.8;  Surgeon: Novak Adine Anes, MD;  Location: [facility];  Service: Ophthalmology;  Laterality: Left;   CYSTOSCOPY N/A 05/08/2020   Procedure: CYSTOSCOPY;  Surgeon: Marne Kelly Nest, MD;  Location: [facility];  Service: Gynecology;  Laterality: N/A;   FACIAL COSMETIC SURGERY     RECTOCELE REPAIR N/A 05/08/2020   Procedure: POSTERIOR REPAIR (RECTOCELE), MCCALLS,;  Surgeon: Marne Kelly Nest, MD;  Location: [facility];  Service: Gynecology;  Laterality: N/A;   SPINAL FUSION     TUBAL LIGATION     VAGINAL HYSTERECTOMY N/A 05/08/2020   Procedure: HYSTERECTOMY VAGINAL;  Surgeon: Marne Kelly Nest, MD;  Location: [facility];  Service: Gynecology;  Laterality: N/A;  need bed    Prior to Admission medications   Medication Sig Start Date End Date Taking? Authorizing Provider  albuterol  (VENTOLIN  HFA) 108 (90 Base) MCG/ACT inhaler Inhale 2 puffs into the lungs every 6 (six) hours as needed for wheezing or shortness of breath. 08/10/20  Yes Danford, Lonni SQUIBB,  MD  ANORO ELLIPTA  62.5-25 MCG/INH AEPB Inhale 1 puff into the lungs daily. 04/03/20  Yes [provider]  atorvastatin  (LIPITOR) 40 MG tablet Take 40 mg by mouth at bedtime. 03/24/20  Yes [provider]  Calcium  Carb-Cholecalciferol 500-125 MG-UNIT TABS Take 1 tablet by mouth daily.   Yes [provider]  Cholecalciferol (VITAMIN D) 50 MCG (2000 UT) CAPS Take 2,000 Units by mouth daily.   Yes [provider]  desvenlafaxine (PRISTIQ) 100 MG 24 hr tablet Take 100 mg by mouth daily.   Yes [provider]  docusate sodium  (COLACE) 100 MG capsule Take 1 capsule (100 mg total) by mouth 2 (two) times daily. 05/09/20  Yes Marne Kelly Nest, MD  DULoxetine  (CYMBALTA ) 60 MG capsule Take 60 mg by mouth daily. 04/03/20  Yes [provider]  fluticasone  (FLONASE ) 50 MCG/ACT nasal spray Place 1 spray into both nostrils daily as needed for allergies or rhinitis.   Yes [provider]  ibuprofen  (ADVIL ) 600 MG tablet Take 1 tablet (600 mg total) by mouth every 6 (six) hours as needed. 05/09/20  Yes Marne Kelly Nest, MD  levothyroxine  (SYNTHROID ) 75 MCG tablet Take 75 mcg by mouth daily before breakfast. 03/24/20  Yes [provider]  lisinopril  (ZESTRIL ) 20 MG tablet Take 20 mg by mouth daily. 01/07/20  Yes [provider]  Multiple Vitamin (MULTIVITAMIN WITH MINERALS) TABS tablet Take 1 tablet by mouth daily.   Yes  [provider]  nicotine (NICODERM CQ - DOSED IN MG/24 HOURS) 21 mg/24hr patch Place 21 mg onto the skin daily.   Yes [provider]  traMADol  (ULTRAM ) 50 MG tablet Take 1 tablet (50 mg total) by mouth every 4 (four) hours as needed for moderate pain. 05/09/20  Yes Marne Kelly Nest, MD  triamcinolone  cream (KENALOG ) 0.1 % Apply 1 application topically daily as needed (psoriasis).  01/03/20  Yes [provider]  varenicline  (CHANTIX ) 0.5 MG tablet Take 0.5 mg (1 tab) once daily for 3 days  then take 0.5 mg (1 tab) twice daily for days 4 through 7 then increase to take 1 mg (2 tabs) twice daily 08/10/20  Yes Danford, Lonni SQUIBB, MD  fluticasone  (FLOVENT  HFA) 110 MCG/ACT inhaler Inhale 2 puffs into the lungs 2 (two) times daily. 08/10/20 08/10/21  Jonel Lonni SQUIBB, MD    Allergies as of 12/21/2023   (No Known Allergies)    History reviewed. No pertinent family history.  Social History   Socioeconomic History   Marital status: Married    Spouse name: Not on file   Number of children: 2   Years of education: 14   Highest education level: Not on file  Occupational History   Not on file  Tobacco Use   Smoking status: Former    Current packs/day: 0.00    Types: Cigarettes    Quit date: 12/2019    Years since quitting: 4.1   Smokeless tobacco: Never  Vaping Use   Vaping status: Never Used  Substance and Sexual Activity   Alcohol use: Never   Drug use: Never   Sexual activity: Not on file  Other Topics Concern   Not on file  Social History Narrative   Not on file   Social Drivers of Health   Financial Resource Strain: Not on file  Food Insecurity: Not on file  Transportation Needs: Not on file  Physical Activity: Not on file  Stress: Not on file  Social Connections: Not on file  Intimate Partner Violence: Not on file    Review of Systems: See HPI, otherwise negative ROS  Physical Exam: BP 136/80   Pulse 91   Temp 97.8 F (36.6 C) (Temporal)   Resp 16   Ht 5' 6 (1.676 m)   Wt 108.9 kg   SpO2 93%   BMI 38.74 kg/m  General:   Alert, cooperative. Head:  Normocephalic and atraumatic. Respiratory:  Normal work of breathing. Cardiovascular:  NAD  Impression/Plan: Marie Delacruz is here for cataract surgery.  Risks, benefits, limitations, and alternatives regarding cataract surgery have been reviewed with the patient.  Questions have been answered.  All parties agreeable.   Adine Novak, MD  02/13/2024, 11:01 AM

## 2024-02-13 NOTE — Transfer of Care (Signed)
 Immediate Anesthesia Transfer of Care Note  Patient: Marie Delacruz  Procedure(s) Performed: PHACOEMULSIFICATION, CATARACT, WITH IOL INSERTION 6.13 00:42.9 (Right: Eye)  Patient Location: PACU  Anesthesia Type:MAC  Level of Consciousness: awake, alert , and oriented  Airway & Oxygen Therapy: Patient Spontanous Breathing  Post-op Assessment: Report given to RN and Post -op Vital signs reviewed and stable  Post vital signs: Reviewed and stable  Last Vitals:  Vitals Value Taken Time  BP 119/70 02/13/24 11:31  Temp 36.3 C 02/13/24 11:31  Pulse 73 02/13/24 11:32  Resp 15 02/13/24 11:32  SpO2 95 % 02/13/24 11:32  Vitals shown include unfiled device data.  Last Pain:  Vitals:   02/13/24 1131  TempSrc:   PainSc: 0-No pain         Complications: No notable events documented.

## 2024-02-13 NOTE — Anesthesia Postprocedure Evaluation (Signed)
 Anesthesia Post Note  Patient: LINDZEY ZENT  Procedure(s) Performed: PHACOEMULSIFICATION, CATARACT, WITH IOL INSERTION 6.13 00:42.9 (Right: Eye)  Patient location during evaluation: PACU Anesthesia Type: MAC Level of consciousness: awake and alert Pain management: pain level controlled Vital Signs Assessment: post-procedure vital signs reviewed and stable Respiratory status: spontaneous breathing, nonlabored ventilation, respiratory function stable and patient connected to nasal cannula oxygen Cardiovascular status: stable and blood pressure returned to baseline Postop Assessment: no apparent nausea or vomiting Anesthetic complications: no   No notable events documented.   Last Vitals:  Vitals:   02/13/24 1131 02/13/24 1137  BP: 119/70 118/74  Pulse: 72 75  Resp: 19 (!) 22  Temp: (!) 36.3 C (!) 36.3 C  SpO2: 96% 93%    Last Pain:  Vitals:   02/13/24 1137  TempSrc:   PainSc: 0-No pain                 Lynwood KANDICE Clause

## 2024-02-13 NOTE — Anesthesia Preprocedure Evaluation (Addendum)
 Anesthesia Evaluation  Patient identified by MRN, date of birth, ID band Patient awake    Reviewed: Allergy & Precautions, H&P , NPO status , Patient's Chart, lab work & pertinent test results, reviewed documented beta blocker date and time   History of Anesthesia Complications (+) PONV and history of anesthetic complications  Airway Mallampati: II  TM Distance: >3 FB Neck ROM: full    Dental no notable dental hx. (+) Teeth Intact   Pulmonary neg pulmonary ROS, pneumonia, resolved, COPD,  COPD inhaler, former smoker   Pulmonary exam normal breath sounds clear to auscultation       Cardiovascular Exercise Tolerance: Poor hypertension, On Medications negative cardio ROS  Rhythm:regular Rate:Normal     Neuro/Psych  PSYCHIATRIC DISORDERS Anxiety Depression    negative neurological ROS  negative psych ROS   GI/Hepatic negative GI ROS, Neg liver ROS,,,  Endo/Other  negative endocrine ROSdiabetes, Well ControlledHypothyroidism    Renal/GU      Musculoskeletal   Abdominal   Peds  Hematology negative hematology ROS (+)   Anesthesia Other Findings   Reproductive/Obstetrics negative OB ROS                             Anesthesia Physical Anesthesia Plan  ASA: 3  Anesthesia Plan: MAC   Post-op Pain Management:    Induction:   PONV Risk Score and Plan: 3  Airway Management Planned:   Additional Equipment:   Intra-op Plan:   Post-operative Plan:   Informed Consent: I have reviewed the patients History and Physical, chart, labs and discussed the procedure including the risks, benefits and alternatives for the proposed anesthesia with the patient or authorized representative who has indicated his/her understanding and acceptance.       Plan Discussed with: CRNA  Anesthesia Plan Comments:        Anesthesia Quick Evaluation

## 2024-02-13 NOTE — Op Note (Signed)
 OPERATIVE NOTE  Marie Delacruz [id number] 02/13/2024   PREOPERATIVE DIAGNOSIS:  Nuclear sclerotic cataract right eye.  H25.11   POSTOPERATIVE DIAGNOSIS:    Nuclear sclerotic cataract right eye.     PROCEDURE:  Phacoemusification with posterior chamber intraocular lens placement of the right eye   LENS:   Implant Name Type Inv. Item Serial No. Manufacturer Lot No. LRB No. Used Action  LENS IOL TECNIS EYHANCE 21.5 - [id number] Intraocular Lens LENS IOL TECNIS EYHANCE 21.5 [id number] [employer]  Right 1 Implanted       Procedure(s): PHACOEMULSIFICATION, CATARACT, WITH IOL INSERTION 6.13 00:42.9 (Right)  SURGEON:  Adine Novak, MD, MPH  ANESTHESIOLOGIST: Anesthesiologist: Myra Lynwood KANDICE, MD CRNA: Jarvis Lew, CRNA   ANESTHESIA:  Topical with tetracaine  drops augmented with 1% preservative-free intracameral lidocaine .  ESTIMATED BLOOD LOSS: less than 1 mL.   COMPLICATIONS:  None.   DESCRIPTION OF PROCEDURE:  The patient was identified in the holding room and transported to the operating room and placed in the supine position under the operating microscope.  The right eye was identified as the operative eye and it was prepped and draped in the usual sterile ophthalmic fashion.   A 1.0 millimeter clear-corneal paracentesis was made at the 10:30 position. 0.5 ml of preservative-free 1% lidocaine  with epinephrine  was injected into the anterior chamber.  The anterior chamber was filled with viscoelastic.  A 2.4 millimeter keratome was used to make a near-clear corneal incision at the 8:00 position.  A curvilinear capsulorrhexis was made with a cystotome and capsulorrhexis forceps.  Balanced salt  solution was used to hydrodissect and hydrodelineate the nucleus.   Phacoemulsification was then used in stop and chop fashion to remove the lens nucleus and epinucleus.  The remaining cortex was then removed using the irrigation and aspiration handpiece. Viscoelastic was then placed into the  capsular bag to distend it for lens placement.  A lens was then injected into the capsular bag.  The remaining viscoelastic was aspirated.   Wounds were hydrated with balanced salt  solution.  The anterior chamber was inflated to a physiologic pressure with balanced salt  solution.   Intracameral vigamox  0.1 mL undiluted was injected into the eye and a drop placed onto the ocular surface.  No wound leaks were noted.  The patient was taken to the recovery room in stable condition without complications of anesthesia or surgery  Adine Novak 02/13/2024, 11:30 AM

## 2024-02-14 ENCOUNTER — Encounter: Payer: Self-pay | Admitting: Ophthalmology
